# Patient Record
Sex: Female | Born: 1985 | Hispanic: Yes | Marital: Single | Smoking: Never smoker
Health system: Southern US, Community
[De-identification: ages and names within clinical notes are randomized; demographics above are authoritative.]

## PROBLEM LIST (undated history)

## (undated) DIAGNOSIS — F419 Anxiety disorder, unspecified: Secondary | ICD-10-CM

## (undated) DIAGNOSIS — F32A Depression, unspecified: Secondary | ICD-10-CM

## (undated) DIAGNOSIS — D649 Anemia, unspecified: Secondary | ICD-10-CM

## (undated) DIAGNOSIS — K8020 Calculus of gallbladder without cholecystitis without obstruction: Secondary | ICD-10-CM

## (undated) DIAGNOSIS — J45909 Unspecified asthma, uncomplicated: Secondary | ICD-10-CM

## (undated) DIAGNOSIS — Z789 Other specified health status: Secondary | ICD-10-CM

## (undated) DIAGNOSIS — R112 Nausea with vomiting, unspecified: Secondary | ICD-10-CM

## (undated) HISTORY — PX: CHOLECYSTECTOMY: SHX55

## (undated) HISTORY — DX: Unspecified asthma, uncomplicated: J45.909

## (undated) HISTORY — DX: Anxiety disorder, unspecified: F41.9

## (undated) HISTORY — DX: Depression, unspecified: F32.A

## (undated) HISTORY — DX: Anemia, unspecified: D64.9

---

## 2006-09-04 ENCOUNTER — Emergency Department (HOSPITAL_COMMUNITY): Admission: EM | Admit: 2006-09-04 | Discharge: 2006-09-04 | Payer: Self-pay | Admitting: Family Medicine

## 2006-09-07 ENCOUNTER — Emergency Department (HOSPITAL_COMMUNITY): Admission: EM | Admit: 2006-09-07 | Discharge: 2006-09-07 | Payer: Self-pay | Admitting: Emergency Medicine

## 2007-09-15 ENCOUNTER — Inpatient Hospital Stay (HOSPITAL_COMMUNITY): Admission: AD | Admit: 2007-09-15 | Discharge: 2007-09-15 | Payer: Self-pay | Admitting: Obstetrics & Gynecology

## 2007-09-15 ENCOUNTER — Ambulatory Visit: Payer: Self-pay | Admitting: *Deleted

## 2007-09-17 ENCOUNTER — Ambulatory Visit (HOSPITAL_COMMUNITY): Admission: RE | Admit: 2007-09-17 | Discharge: 2007-09-17 | Payer: Self-pay | Admitting: Obstetrics & Gynecology

## 2007-12-29 ENCOUNTER — Ambulatory Visit: Payer: Self-pay | Admitting: Physician Assistant

## 2007-12-29 ENCOUNTER — Inpatient Hospital Stay (HOSPITAL_COMMUNITY): Admission: AD | Admit: 2007-12-29 | Discharge: 2007-12-29 | Payer: Self-pay | Admitting: Obstetrics

## 2008-01-07 ENCOUNTER — Ambulatory Visit: Payer: Self-pay | Admitting: Family Medicine

## 2008-01-07 ENCOUNTER — Observation Stay (HOSPITAL_COMMUNITY): Admission: AD | Admit: 2008-01-07 | Discharge: 2008-01-07 | Payer: Self-pay | Admitting: Family Medicine

## 2008-01-10 ENCOUNTER — Inpatient Hospital Stay (HOSPITAL_COMMUNITY): Admission: AD | Admit: 2008-01-10 | Discharge: 2008-01-12 | Payer: Self-pay | Admitting: Obstetrics & Gynecology

## 2008-01-10 ENCOUNTER — Ambulatory Visit: Payer: Self-pay | Admitting: Obstetrics & Gynecology

## 2008-03-12 IMAGING — CR DG SACRUM/COCCYX 2+V
3 series · 3 of 3 positions shown · non-contrast
Comparison: none

CLINICAL DATA: Status post fall.  Sacral pain.  
 SACRUM - 2 VIEW:
 Sacrum and coccyx intact. Negative for fracture.  SI joints symmetric and normal.

[view not recorded (1 of 3)]
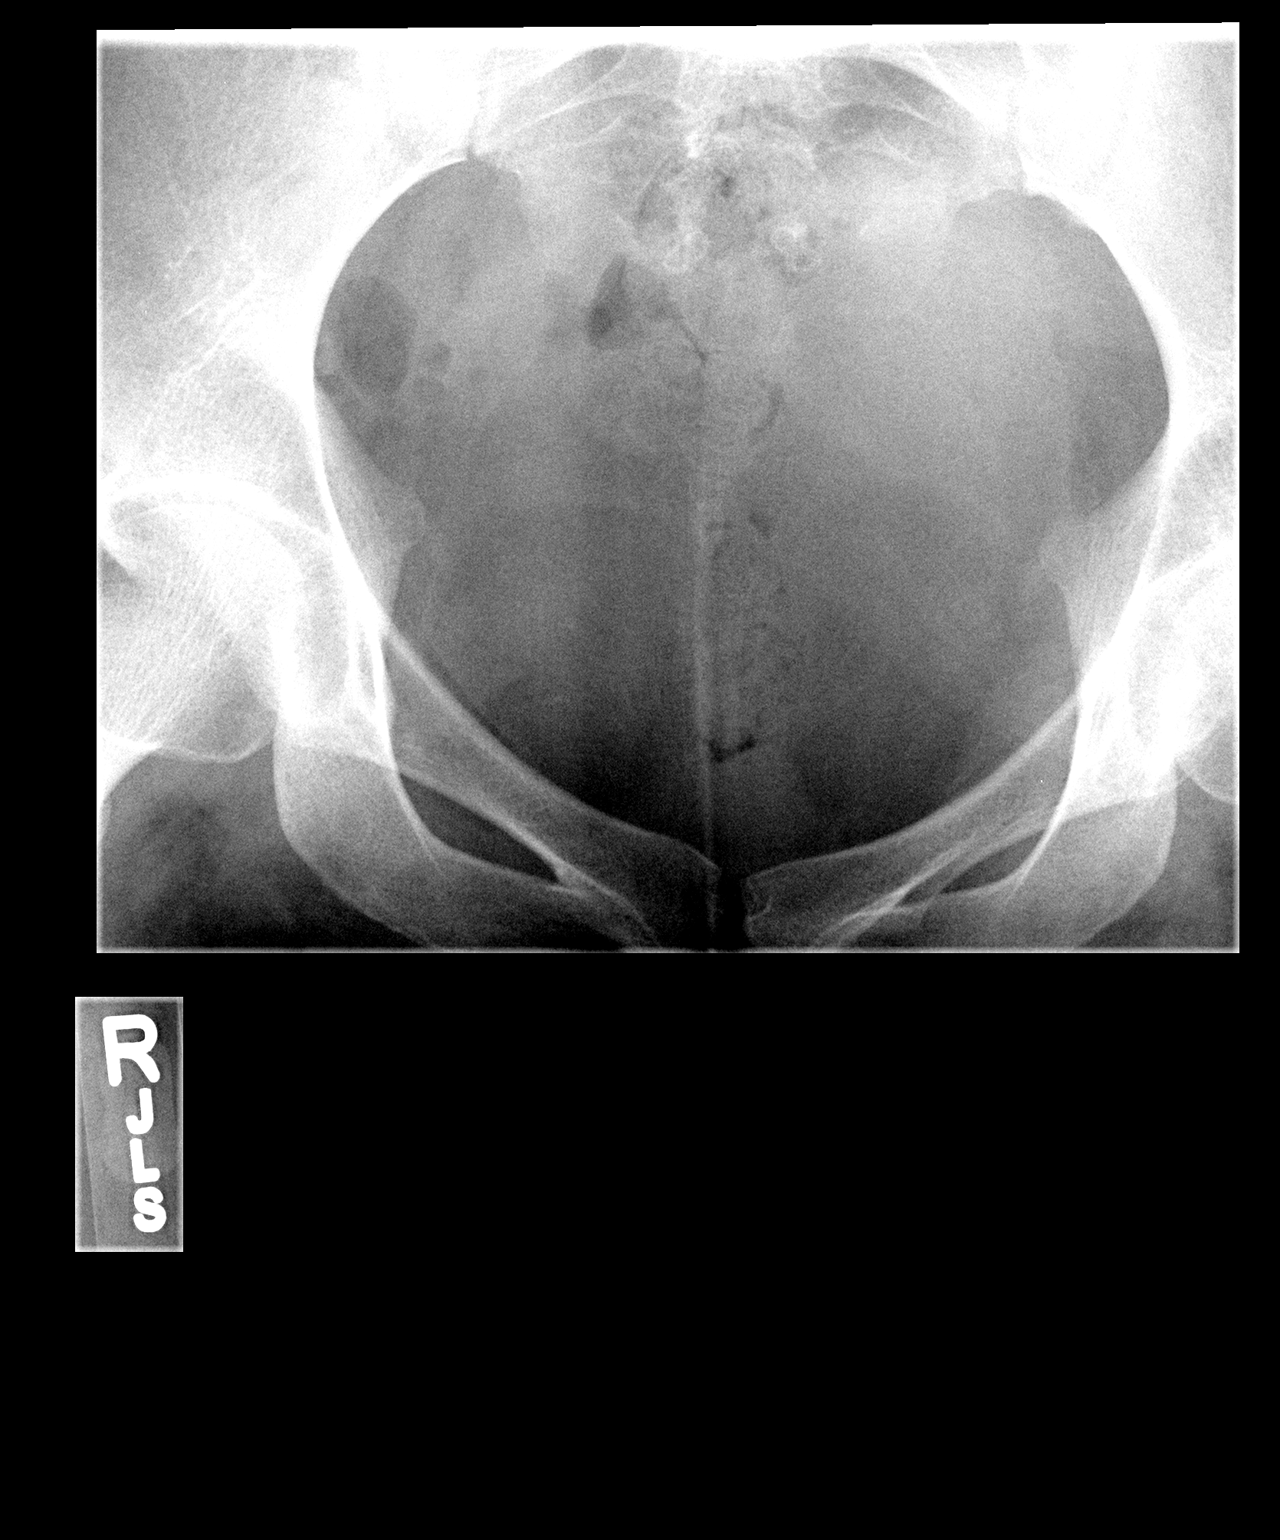

[view not recorded (2 of 3)]
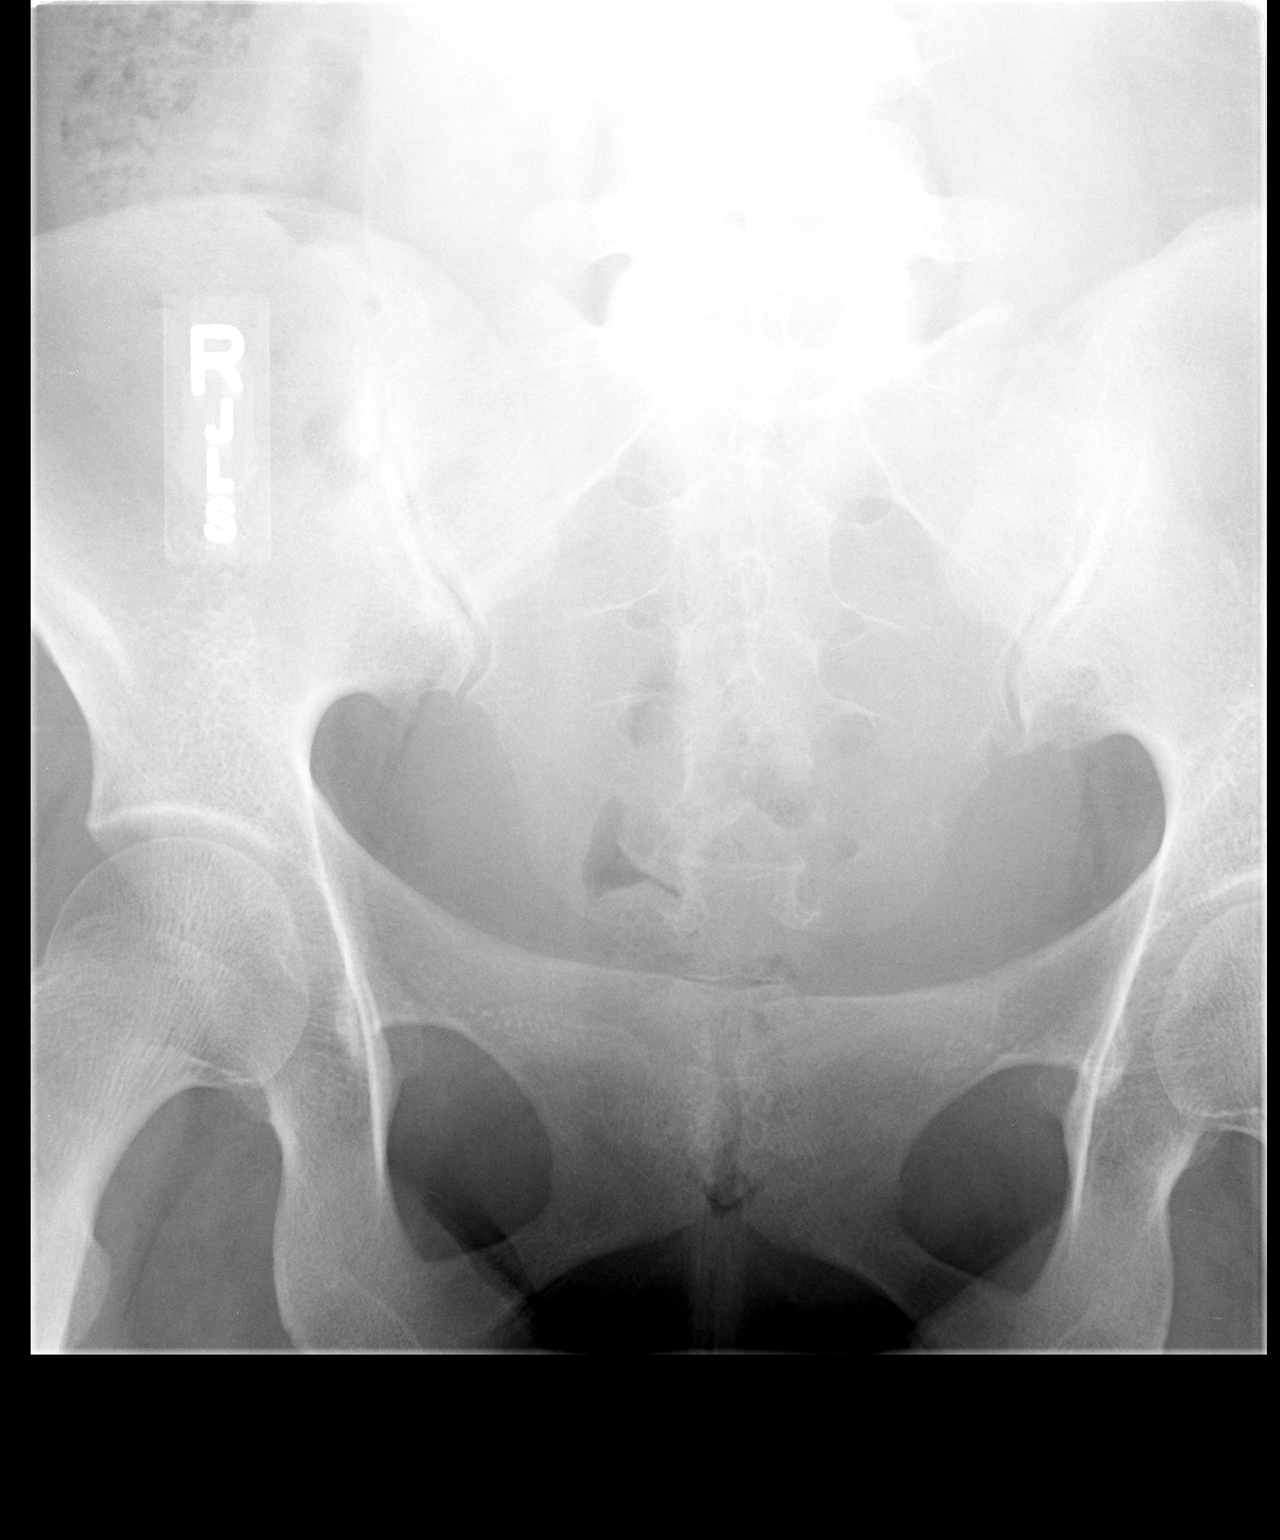

[view not recorded (3 of 3)]
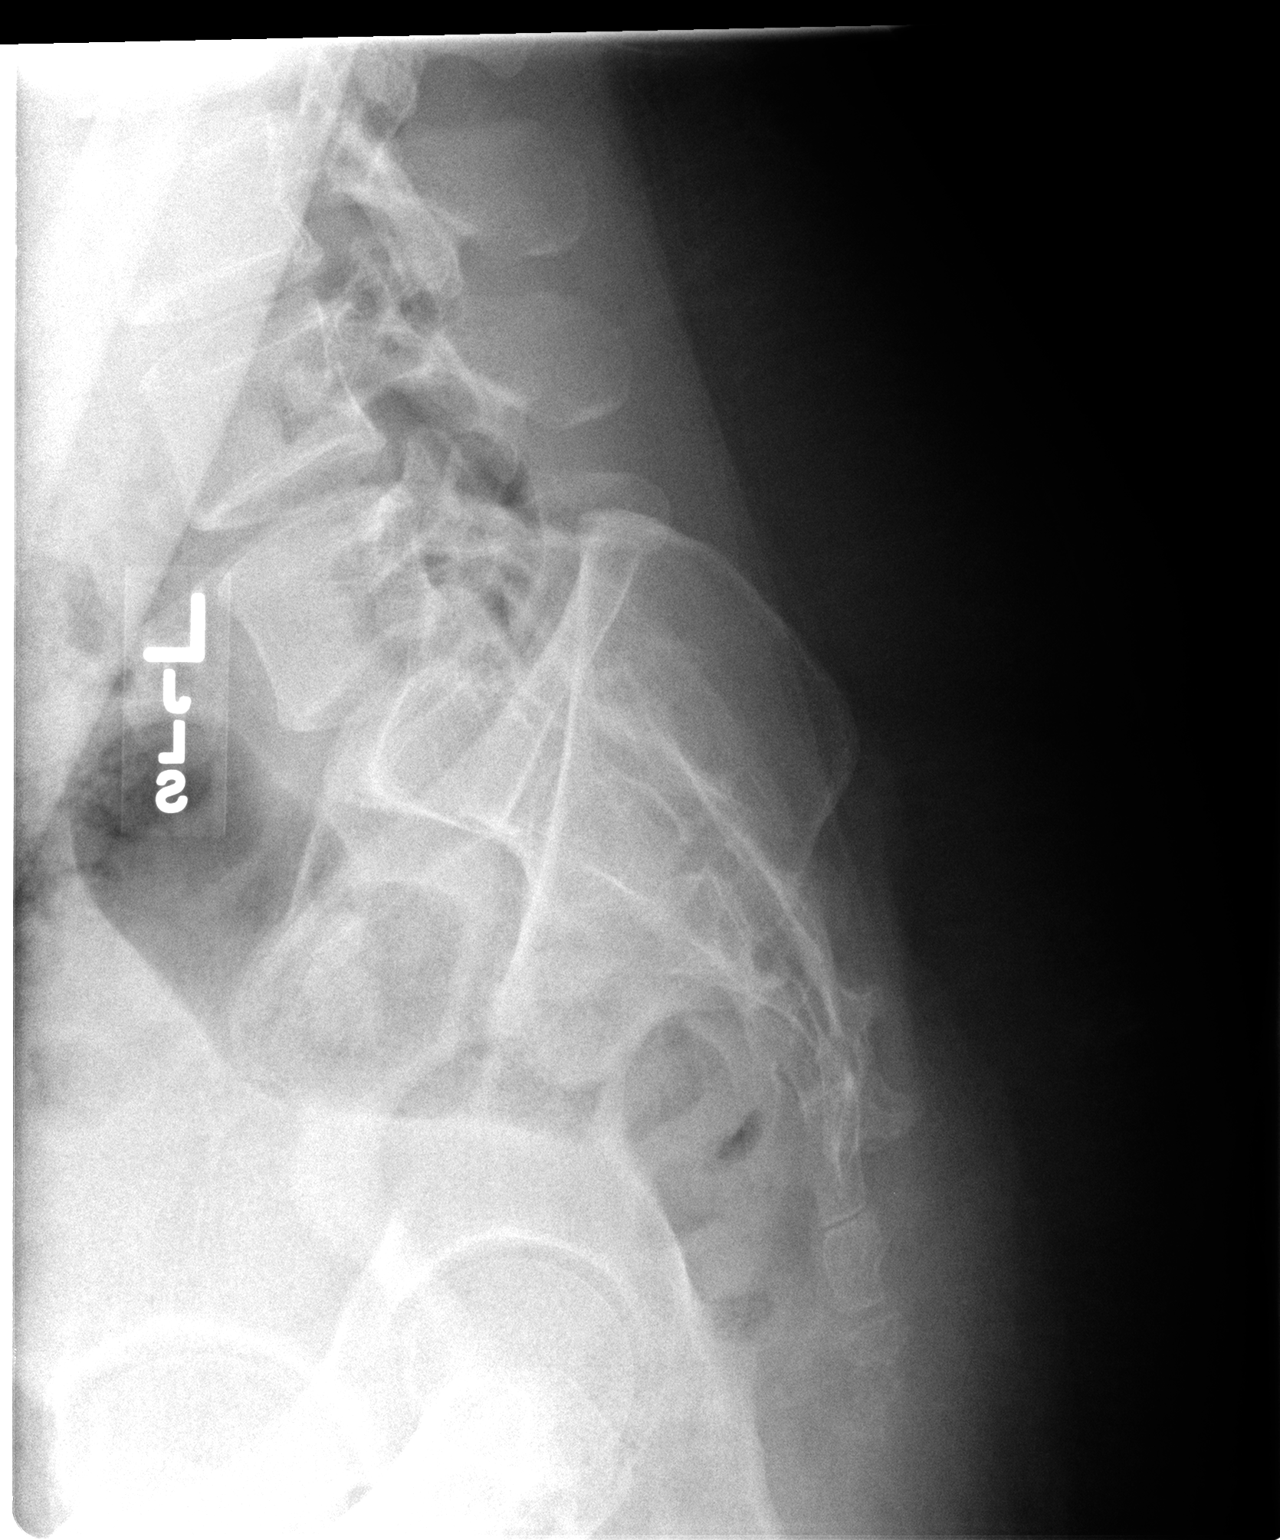

[3 of 3 positions shown; findings below may reference images not displayed]

IMPRESSION: Negative for fracture.

## 2008-08-14 ENCOUNTER — Encounter (INDEPENDENT_AMBULATORY_CARE_PROVIDER_SITE_OTHER): Payer: Self-pay | Admitting: Surgery

## 2008-08-14 ENCOUNTER — Inpatient Hospital Stay (HOSPITAL_COMMUNITY): Admission: EM | Admit: 2008-08-14 | Discharge: 2008-08-15 | Payer: Self-pay | Admitting: Family Medicine

## 2008-08-14 HISTORY — PX: CHOLECYSTECTOMY: SHX55

## 2010-04-23 ENCOUNTER — Encounter (INDEPENDENT_AMBULATORY_CARE_PROVIDER_SITE_OTHER): Payer: Self-pay | Admitting: *Deleted

## 2010-04-23 ENCOUNTER — Ambulatory Visit: Payer: Self-pay | Admitting: Family Medicine

## 2010-04-23 LAB — CONVERTED CEMR LAB
Albumin: 4.4 g/dL (ref 3.5–5.2)
Alkaline Phosphatase: 72 units/L (ref 39–117)
BUN: 13 mg/dL (ref 6–23)
Basophils Absolute: 0 10*3/uL (ref 0.0–0.1)
Basophils Relative: 0 % (ref 0–1)
Chloride: 104 meq/L (ref 96–112)
Eosinophils Absolute: 0.2 10*3/uL (ref 0.0–0.7)
Eosinophils Relative: 2 % (ref 0–5)
Glucose, Bld: 91 mg/dL (ref 70–99)
Lymphs Abs: 3.9 10*3/uL (ref 0.7–4.0)
MCV: 83.5 fL (ref 78.0–100.0)
Monocytes Relative: 7 % (ref 3–12)
Sodium: 141 meq/L (ref 135–145)
TSH: 2.007 microintl units/mL (ref 0.350–4.500)
Total Bilirubin: 0.3 mg/dL (ref 0.3–1.2)
Total Protein: 7.1 g/dL (ref 6.0–8.3)
WBC: 8.6 10*3/uL (ref 4.0–10.5)

## 2010-07-02 ENCOUNTER — Emergency Department (HOSPITAL_COMMUNITY)
Admission: EM | Admit: 2010-07-02 | Discharge: 2010-07-02 | Payer: Self-pay | Source: Home / Self Care | Admitting: Family Medicine

## 2010-09-23 LAB — POCT URINALYSIS DIPSTICK
Ketones, ur: NEGATIVE mg/dL
Protein, ur: NEGATIVE mg/dL
pH: 6 (ref 5.0–8.0)

## 2010-09-23 LAB — CBC
HCT: 40.5 % (ref 36.0–46.0)
Hemoglobin: 13.7 g/dL (ref 12.0–15.0)
MCHC: 33.8 g/dL (ref 30.0–36.0)
MCV: 83.9 fL (ref 78.0–100.0)
Platelets: 363 10*3/uL (ref 150–400)
RDW: 13.8 % (ref 11.5–15.5)
WBC: 14.1 10*3/uL — ABNORMAL HIGH (ref 4.0–10.5)

## 2010-09-23 LAB — DIFFERENTIAL
Basophils Absolute: 0 10*3/uL (ref 0.0–0.1)
Eosinophils Absolute: 0 10*3/uL (ref 0.0–0.7)
Eosinophils Relative: 0 % (ref 0–5)
Monocytes Absolute: 0.6 10*3/uL (ref 0.1–1.0)
Monocytes Relative: 4 % (ref 3–12)
Neutro Abs: 10.1 10*3/uL — ABNORMAL HIGH (ref 1.7–7.7)
Neutrophils Relative %: 72 % (ref 43–77)

## 2010-09-23 LAB — POCT I-STAT, CHEM 8
Calcium, Ion: 1.12 mmol/L (ref 1.12–1.32)
Chloride: 106 mEq/L (ref 96–112)
Hemoglobin: 15 g/dL (ref 12.0–15.0)
Potassium: 3.8 mEq/L (ref 3.5–5.1)

## 2010-09-23 LAB — CULTURE, ROUTINE-ABSCESS

## 2010-09-23 LAB — POCT PREGNANCY, URINE: Preg Test, Ur: NEGATIVE

## 2010-10-28 LAB — URINALYSIS, ROUTINE W REFLEX MICROSCOPIC
Bilirubin Urine: NEGATIVE
Glucose, UA: NEGATIVE mg/dL
Hgb urine dipstick: NEGATIVE
Ketones, ur: 15 mg/dL — AB

## 2010-10-28 LAB — DIFFERENTIAL
Basophils Relative: 0 % (ref 0–1)
Lymphs Abs: 1.9 10*3/uL (ref 0.7–4.0)
Monocytes Absolute: 0.6 10*3/uL (ref 0.1–1.0)
Monocytes Relative: 4 % (ref 3–12)
Neutro Abs: 10.7 10*3/uL — ABNORMAL HIGH (ref 1.7–7.7)
Neutrophils Relative %: 81 % — ABNORMAL HIGH (ref 43–77)

## 2010-10-28 LAB — HEPATIC FUNCTION PANEL
ALT: 127 U/L — ABNORMAL HIGH (ref 0–35)
AST: 143 U/L — ABNORMAL HIGH (ref 0–37)
Albumin: 3.7 g/dL (ref 3.5–5.2)
Indirect Bilirubin: 0.8 mg/dL (ref 0.3–0.9)
Total Bilirubin: 1.4 mg/dL — ABNORMAL HIGH (ref 0.3–1.2)

## 2010-10-28 LAB — BASIC METABOLIC PANEL
BUN: 8 mg/dL (ref 6–23)
CO2: 26 mEq/L (ref 19–32)
GFR calc non Af Amer: >60 mL/min (ref >60)
Potassium: 3.4 mEq/L — ABNORMAL LOW (ref 3.5–5.1)
Sodium: 137 mEq/L (ref 135–145)

## 2010-10-28 LAB — CBC
HCT: 40.5 % (ref 36.0–46.0)
MCHC: 33.9 g/dL (ref 30.0–36.0)
Platelets: 356 10*3/uL (ref 150–400)
RDW: 13.5 % (ref 11.5–15.5)
WBC: 13.2 10*3/uL — ABNORMAL HIGH (ref 4.0–10.5)

## 2010-10-28 LAB — PROTIME-INR: Prothrombin Time: 13.8 seconds (ref 11.6–15.2)

## 2010-10-28 LAB — APTT: aPTT: 30 seconds (ref 24–37)

## 2010-10-28 LAB — URINE MICROSCOPIC-ADD ON

## 2010-10-29 LAB — TYPE AND SCREEN: Antibody Screen: NEGATIVE

## 2010-10-29 LAB — ABO/RH: ABO/RH(D): O POS

## 2010-10-29 LAB — COMPREHENSIVE METABOLIC PANEL
AST: 292 U/L — ABNORMAL HIGH (ref 0–37)
Albumin: 3.1 g/dL — ABNORMAL LOW (ref 3.5–5.2)
Alkaline Phosphatase: 89 U/L (ref 39–117)
BUN: 6 mg/dL (ref 6–23)
BUN: 7 mg/dL (ref 6–23)
Calcium: 8.6 mg/dL (ref 8.4–10.5)
Chloride: 106 mEq/L (ref 96–112)
GFR calc Af Amer: >60 mL/min (ref >60)
Glucose, Bld: 104 mg/dL — ABNORMAL HIGH (ref 70–99)
Potassium: 3.2 mEq/L — ABNORMAL LOW (ref 3.5–5.1)
Total Protein: 5.8 g/dL — ABNORMAL LOW (ref 6.0–8.3)
Total Protein: 6 g/dL (ref 6.0–8.3)

## 2010-10-29 LAB — CBC
HCT: 36.9 % (ref 36.0–46.0)
HCT: 37.3 % (ref 36.0–46.0)
Hemoglobin: 12.2 g/dL (ref 12.0–15.0)
MCHC: 33 g/dL (ref 30.0–36.0)
Platelets: 279 10*3/uL (ref 150–400)
Platelets: 280 10*3/uL (ref 150–400)
RDW: 13.6 % (ref 11.5–15.5)
RDW: 14.2 % (ref 11.5–15.5)
WBC: 6.7 10*3/uL (ref 4.0–10.5)

## 2010-10-29 LAB — DIFFERENTIAL
Basophils Relative: 0 % (ref 0–1)
Eosinophils Relative: 0 % (ref 0–5)
Lymphocytes Relative: 35 % (ref 12–46)
Monocytes Absolute: 0.4 10*3/uL (ref 0.1–1.0)
Monocytes Relative: 6 % (ref 3–12)
Neutro Abs: 3.9 10*3/uL (ref 1.7–7.7)

## 2010-11-26 NOTE — H&P (Signed)
Lauren Barnes, Lauren Barnes              ACCOUNT NO.:  [id number]   MEDICAL RECORD NO.:  [id number]          PATIENT TYPE:  INP   LOCATION:  9110                          FACILITY:  [facility]   PHYSICIAN:  Allie Bossier, MD        DATE OF BIRTH:  Sep 30, 1985   DATE OF ADMISSION:  01/10/2008  DATE OF DISCHARGE:                              HISTORY & PHYSICAL   Lauren Barnes is a 25 year old single Hispanic gravida 1 at 40 weeks estimated  gestational age.  This is by several confirmatory ultrasounds as her LMP  gave her a due date of December 23, 2007.  She has been noted to be breech  on several ultrasounds, eversion was attempted.  this was unsuccessful.   PAST MEDICAL HISTORY:  None.   PAST SURGICAL HISTORY:  None.   SOCIAL HISTORY:  Negative for tobacco, alcohol or drug use.   FAMILY HISTORY:  Negative for breast, GYN and colon malignancies.   ALLERGIES:  No known drug allergies.   MEDICATIONS:  Prenatal vitamins daily.   REVIEW OF SYSTEMS:  The father of the baby is not involved.   PHYSICAL EXAMINATION:  VITAL SIGNS:  Stable, afebrile.  The baby is  confirmed to be in a breech presentation by Leopold's maneuvers.  HEENT:  Normal.  HEART:  Regular rhythm.  LUNGS:  Clear to auscultation bilaterally.  ABDOMEN:  Benign, gravid.   ASSESSMENT AND PLAN:  39-week pregnancy, breech presentation,  unsuccessful eversion.  We will plan for a primary low-transverse  cesarean section.  We discussed the risks of surgery.      Allie Bossier, MD  Electronically Signed     Allie Bossier, MD  Electronically Signed   MCD/MEDQ  D:  01/10/2008  T:  01/10/2008  Job:  [id number]

## 2010-11-26 NOTE — Op Note (Signed)
Lauren Barnes, Lauren Barnes              ACCOUNT NO.:  [id number]   MEDICAL RECORD NO.:  [id number]          PATIENT TYPE:  INP   LOCATION:  5120                         FACILITY:  [facility]   PHYSICIAN:  Thornton Park. Daphine Deutscher, MD  DATE OF BIRTH:  [birth year]   DATE OF PROCEDURE:  08/14/2008  DATE OF DISCHARGE:                               OPERATIVE REPORT   PREOPERATIVE DIAGNOSIS:  Acute cholecystitis.   POSTOPERATIVE DIAGNOSIS:  Acute cholecystitis.  Also, the patient  appears to have a lower C-section hernia with a little omentum in it.   PROCEDURE:  Laparoscopic cholecystectomy and intraoperative  cholangiogram.   SURGEON:  Thornton Park. Daphine Deutscher, MD   ASSISTANT:  Anselm Pancoast. Weatherly, MD   ANESTHESIA:  General endotracheal.   FINDINGS:  Acute cholecystitis, normal cholangiogram with dilated duct  and some sphincter spasm.   DESCRIPTION OF PROCEDURE:  [name] was taken to room 17 on Monday,  August 14, 2008, given general anesthesia.  The abdomen was prepped  with Techni-Care and draped sterilely.  A longitudinal incision was made  down under umbilicus and through that I inserted a Hasson cannula.  With  Hasson place, insufflation with with CO2, placed 3 trocars in upper  abdomen.  Dr. Zachery Dakins was my assist and we grasped the gallbladder and  elevated it.  I dissected free Calot triangle with a hook electrocautery  without difficulty, isolating the cystic duct and cystic arteries, put  in a clip upon the gallbladder.  The cystic duct was incised and a  Reddick catheter was inserted and a dynamic cholangiogram was obtained  which showed pretty dilated common bile duct with flow eventually into  the duodenum, although it appeared there was some distal spasm.  Cystic  duct was then triple clipped and divided and the arteries were clipped  and divided and the gallbladder was removed in toto with hook  electrocautery.  It was placed in a bag and brought through the  umbilicus.  With the  bag hooked to the gallbladder bed, it appeared to  be dry and everything was in order.  The port sites were all injected  with Marcaine and then the umbilical defect was closed with 0 Vicryl  figure-of-eight and the wounds were injected with Marcaine and closed 4-  0 Vicryl with Benzoin and Steri-Strips on the skin.  The patient  tolerated the procedure well and was taken to recovery room in  satisfactory condition.      Thornton Park Daphine Deutscher, MD  Electronically Signed     MBM/MEDQ  D:  08/14/2008  T:  08/15/2008  Job:  [id number]

## 2010-11-26 NOTE — Op Note (Signed)
NAMEKAMYIA, THOMASON              ACCOUNT NO.:  [id number]   MEDICAL RECORD NO.:  [id number]          PATIENT TYPE:  INP   LOCATION:  9110                          [facility]ACILITY:  [facility]   PHYSICIAN:  Allie Bossier, MD        DATE OF BIRTH:  [birth year]   DATE OF PROCEDURE:  01/10/2008  DATE OF DISCHARGE:                               OPERATIVE REPORT   PREOPERATIVE DIAGNOSIS:  Breech at 79 weeks' estimated gestational age,  failed version.   POSTOPERATIVE DIAGNOSIS:  Breech at 71 weeks' estimated gestational age,  failed version.   PROCEDURE:  Primary low-transverse cesarean section.   SURGEON:  Allie Bossier, MD   ANESTHESIA:  Spinal, Octaviano Glow. Pamalee Leyden, MD   COMPLICATIONS:  None.   ESTIMATED BLOOD LOSS:  Less than 500 mL.   SPECIMEN:  Cord blood.   FINDINGS:  1. Living female infant, Apgars 5 and 9 at 1 and 5 minutes.  2. Normal pelvic anatomy.  3. Intact placenta with 3-vessel cord.  4. Minimal amount of amniotic fluid noted.  5. Loose nuchal cord x2.   DETAILED PROCEDURE AND FINDINGS:  The risks, benefits, and alternatives  of the surgery, explained, understood and accepted, consents were  signed.  Breech presentation was confirmed by Leopold's maneuvers in the  preop holding area and consents were signed in the preop area.  All  questions were answered.  In the operating room, spinal anesthesia was  applied without complication.  She was placed in a dorsal supine  position with a left lateral tilt.  Her abdomen was prepped and draped  in the usual sterile fashion.  A Foley catheter was placed.  Clear urine  was drained from the Foley catheter throughout the case.  After adequate  anesthesia was assured, a transverse incision was made approximately 82  cm above the symphysis pubis.  Incision was carried down through the  scant amount of subcutaneous tissue of the fascia.  Bleeding encountered  was cauterized with a Bovie.  Fascia was scored in midline.  Fascial  incision was  extended bilaterally and curved slightly upwards.  The  middle one-third of the rectus muscles were separated in transverse  fashion using electrosurgical technique.  The pyramidalis and the middle  one-third of the rectus muscles were separated in transverse fashion  using electrosurgical technique.  Excellent hemostasis was maintained.  The remainder of the rectus muscles were gently pulled in opposite  direction.  Peritoneum was entered with hemostats.  Peritoneal incision  was extended bilaterally with the Bovie taking care to avoid the  bladder.  Bladder blade was placed.  Transverse incision was made in the  wall of lower uterine segment.  Amniotomy was performed with hemostats.  There was absence of fluid noted and the baby was in a breech  presentation with his legs crossed.  The legs were uncrossed and  delivered.  The anterior shoulder was delivered.  The baby was rotated.  The posterior shoulder was then delivered.  The neck was kept flexed and  the baby was delivered.  Loose nuchal cords were removed off  the neck x2  following delivery.  The mouth and nostrils were suctioned.  The cord  was clamped and cut.  The baby was transferred to the NICU personnel for  routine care of late.  His Apgars were as listed above.  Its weight was  7 pounds 7 ounces.  Cord blood sample was obtained.  The placenta was  extracted using traction and manual removal.  The uterus was left in  situ.  Bladder blade was placed and the uterine interior was cleaned  with a dry lap sponge.  The uterine incision was closed with a layer of  0 chromic running locking suture.  Excellent hemostasis was noted.  By  tilting the uterus at each side, I was able to visualize the adnexa.  They were normal.  The uterine incision and rectus muscles were again  inspected and noted to be hemostatic.  The fascia was closed with 0  Vicryl running nonlocking suture.  No defects were palpable.  Subcutaneous tissue was  irrigated, cleaned, and dried.  It was then  infiltrated with 13 mL of 0.5% Marcaine.  Subcuticular closure was done  with 3-0 Vicryl sutures.  Steri-Strips were placed across the incision.  Foley catheter drained clear urine throughout.  She was taken to  recovery room in stable condition.  The instrument, sponge, and needle  counts were correct.      Allie Bossier, MD  Electronically Signed     MCD/MEDQ  D:  01/10/2008  T:  01/11/2008  Job:  [id number]

## 2010-11-26 NOTE — H&P (Signed)
NAMEAYJAH, SHOW              ACCOUNT NO.:  [id number]   MEDICAL RECORD NO.:  [id number]          PATIENT TYPE:  INP   LOCATION:  5120                         FACILITY:  [facility]   PHYSICIAN:  Cherylynn Ridges, M.D.    DATE OF BIRTH:  05-08-1986   DATE OF ADMISSION:  08/13/2008  DATE OF DISCHARGE:                              HISTORY & PHYSICAL   CHIEF COMPLAINT:  The patient is a 25 year old non-English speaking  Hispanic female with abdominal pain including cholelithiasis and likely  choledocholithiasis.   HISTORY OF PRESENT ILLNESS:  The patient has been irritant about 1  0'clock the afternoon of August 13, 2008.  She had epigastric pain,  nausea, vomiting.  No fevers or chills.  She was not noticed to be  jaundiced.  This is not the first episode that she has had.  She has had  multiple, but this is the worst.   PAST MEDICAL HISTORY:  Unremarkable.   PAST SURGICAL HISTORY:  None.   MEDICATIONS:  She takes some  medication.   ALLERGIES:  She has no allergies.   SOCIAL HISTORY:  She is a nondrinker, nonsmoker.  Otherwise, has been  healthy, mildly or moderately obese.   PHYSICAL EXAMINATION:  GENERAL:  On exam, she is afebrile.  VITAL SIGNS:  Blood pressure 120/70, pulse is 60.  HEENT:  She is normocephalic and atraumatic.  She does not appear to be  icteric.  NECK:  Supple.  No palpable mass.  LUNGS:  Clear.  CARDIAC:  Regular rate and rhythm.  No murmurs, gallops, rubs, or  heaves.  ABDOMEN:  Soft and tender in the epigastrium.  She has a positive Murphy  sign with tenderness in the right upper quadrant.  RECTAL AND PELVIC:  Not performed.  EXTREMITIES:  She has got no cyanosis, clubbing or edema.  No evidence  of DVT.  There are no skin rashes.  NEUROLOGICAL:  Cranial nerves II through  XII are grossly intact.  PSYCHIATRIC:  She has a somewhat sad mood because of her onset of pain,  but has normal judgment and orientation.   LABORATORY DATA:  Her white count is  elevated at 13,000 to 22,000 with a  left shift, hemoglobin is normal.  Lipase is normal.  AST and ALT are  elevated with the normal alkaline phosphatase and a total bilirubin of  1.3.  The lipase is normal.  Ultrasound demonstrates a 12 common bile  duct.   IMPRESSION:  Cholelithiasis and likely choledocholithiasis and then an  otherwise healthy 25 year old Hispanic female.   PLAN:  The plan is to admit the patient for pain control, IV  antibiotics, and IV hydration.  We will also get a GI consultation as  the patient will possibly need an ERCP depending upon her repeat studies  in the morning.      Cherylynn Ridges, M.D.  Electronically Signed     JOW/MEDQ  D:  08/14/2008  T:  08/14/2008  Job:  [id number]

## 2010-11-26 NOTE — Discharge Summary (Signed)
[name], Lauren              ACCOUNT NO.:  [id number]   MEDICAL RECORD NO.:  [id number]          PATIENT TYPE:  INP   LOCATION:  5120                         FACILITY:  [facility]   PHYSICIAN:  Thornton Park. Daphine Deutscher, MD  DATE OF BIRTH:  10/29/1985   DATE OF ADMISSION:  08/13/2008  DATE OF DISCHARGE:  08/15/2008                               DISCHARGE SUMMARY   ADMITTING PHYSICIAN:  Dr. Lindie Spruce.   DISCHARGING PHYSICIAN:  Dr. Daphine Deutscher.   CHIEF COMPLAINT/REASON FOR ADMISSION:  Lauren Barnes is a 22-year female  patient, who speaks mainly Spanish, who presented with epigastric  abdominal pain that began about 1 p.m. on August 13, 2008, it persisted  and awakened her from sleep.  She has had nausea and vomiting.  No  fevers.  She presented to the ER where she was found to have a  leukocytosis 13,200, AST 143, ALT 127, alkaline phos was normal, and  total bilirubin was 1.4.  She was tender in the epigastric region and  the right upper quadrant on exam per the emergency room physician's  report and her abdominal ultrasound showed cholelithiasis and common  bile duct dilated to 12 mm.  Dr. Lindie Spruce examined the patient in the ER.  The patient looks somewhat pale, was tender in the epigastric region and  the right upper quadrant regions on his exam, and he felt the patient  had acute cholecystitis and question choledocholithiasis.   ADMITTING DIAGNOSES:  1. Acute cholecystitis.  2. Transaminitis with dilated common bile duct to rule out      choledocholithiasis.   HOSPITAL COURSE:  The patient was admitted to the General Surgical Floor  where she remained afebrile.  Vital signs were stable.  Her abdomen was  soft with minimal right upper quadrant tenderness after administration  of IV pain medications.  Her followup lab work revealed a normal white  count of 6700, total bilirubin 1.5, AST up to 292, and ALT 237.  Dr.  Daphine Deutscher examined the patient and felt that although her liver enzymes  were  mildly trending upwards that this was not indicative of  choledocholithiasis given a normal alk phos and a minimally elevated  total bilirubin, so he felt it was safe to proceed with operative  intervention.   On August 14, 2008, the patient was taken the OR by Dr. Daphine Deutscher.  At  admission, she had been empirically placed on Cipro IV for enteric  pathogen coverage, this was continued.  The patient subsequently  underwent a laparoscopic cholecystectomy with a normal intraoperative  cholangiogram and was sent back to the general floor to recover.   In immediate postop period, the patient did well.  She did have 2  episodes within the first 12 hours of significant urinary retention, the  first episode was 1100 mL of urine retained removed with I and O cath,  the second was early on the morning of postop day #1 about 5 a.m.  She  had I and O cath done then that relieved the bladder of 800 mL of fluid.  By my exam on postop day #1, her abdomen  was tender over incisions and  nondistended.  She had not started a diet, was still on PCA morphine.  Plans were to advance diet, add Toradol to minimize narcotic use in the  event this was contributing to some of her urinary retention, and  hopefully if the patient's diet was advanced tolerating oral pain  medicines and voiding without difficulty, we can discharge her home  today on postop day #1.  As noted, her incisions are clean, dry, and  intact and she is tender over the incision lines only not away from the  incisions.  Plans were to have the patient's discharge instructions  given to her verbally in Spanish and also the nurses have provided care  notes in Spanish to send home with the patient regarding her condition  and anticipated followup issues.   FINAL DISCHARGE DIAGNOSES:  1. Acute cholecystitis and cholelithiasis.  2. Status post laparoscopic cholecystectomy with normal intraoperative      cholangiogram.  3. Postoperative urinary  retention.   DISCHARGE MEDICATIONS:  The patient was not on medications before  admission.  These are her medications at discharge:  1. Percocet 5/325 one to two tablets every 4 hours as needed for pain.  2. Ibuprofen 600 mg 1 tablet every 8 hours as needed for pain, may      take in addition to Percocet, i.e., in between doses as ordered.      Always take with food or snacks.   Return to work in 2 weeks, note given.   DIET:  No restrictions.   WOUND CARE:  Remove bandages tomorrow on August 16, 2008, and after  that allow Steri-Strips to fall off.   ACTIVITY:  Increase activity slowly.  May walk up steps.  May shower.  No lifting greater than 15 pounds for 2 weeks.  No driving for 1 week.   FOLLOWUP APPOINTMENT:  The patient has been instructed to follow up at  the [facility] at [facility] on Tuesday,  August 29, 2008, at 2:15 p.m.  She is to arrive at 2 p.m.   ADDITIONAL INSTRUCTIONS:  The patient is to call the MD's office at [phone] if she has:  A.  Fever greater than or equal to 101.5 degrees Fahrenheit by mouth.  B.  New or increased belly pain.  C.  Redness or drainage from her wounds.  D.  Nausea, vomiting, diarrhea, or constipation.       Allison L. Gwyneth Sprout Daphine Deutscher, MD  Electronically Signed    ALE/MEDQ  D:  08/15/2008  T:  08/15/2008  Job:  [id number]

## 2010-11-29 NOTE — Discharge Summary (Signed)
Lauren Barnes, Lauren Barnes              ACCOUNT NO.:  [id number]   MEDICAL RECORD NO.:  [id number]          PATIENT TYPE:  INP   LOCATION:  9110                          FACILITY:  [facility]   PHYSICIAN:  Tanya S. Shawnie Pons, M.D.   DATE OF BIRTH:  [birth year]   DATE OF ADMISSION:  01/10/2008  DATE OF DISCHARGE:  01/12/2008                               DISCHARGE SUMMARY   Lauren Barnes was admitted on January 10, 2008, for a primary low-transverse  cesarean section for breech presentation after several failed attempts  at ultrasound-assisted version was unsuccessful at 39 weeks' gestation.  Lauren Barnes is a 25 year old gravida 1 at 66 weeks' gestation, who had  no past medical history and no past surgical history.   ADMITTING DIAGNOSES:  1. A 39-week gestation.  2. Primigravida.  3. Primary low-transverse cesarean section.  4. Breech presentation after failed version.   OPERATION PERFORMED:  Primary low-transverse cesarean section for breech  presentation.   POSTOPERATIVE DIAGNOSES:  1. A 39-week gestation.  2. Primigravida.  3. Primary low-transverse cesarean section.  4. Breech presentation after failed version.   Procedure was performed by Dr. Nicholaus Bloom and was uncomplicated.   ESTIMATED BLOOD LOSS:  500 mL.   FINDINGS:  Findings of that surgery were a living female infant.  Apgars  were 5 and 9.  For further details, please see the dictated operative  note.   The patient had an uneventful postoperative course.  Throughout the  course of her stay, she remained afebrile.  Vital signs remained stable.  The patient was discharged on postoperative day #3 on January 12, 2008.  At  which time, her postoperative hemoglobin was 10.3 and hematocrit was  29.7.  Vital signs were stable.  Exam was found to be within normal  limits.  Her status was stable.   DISCHARGE DIAGNOSES:  A 25 year old gravida 1, para 1-0-0-1.  On  postoperative day #3, status post primary low-transverse cesarean  section for  breech presentation.  She was given Depo-Provera 150 mg IM  prior to discharge.  She was breast and bottle feeding at the time of  discharge.  She was given prescriptions for Motrin 600 mg p.o. q.6 h  p.r.n. and Percocet 5/325 1-2 tablets q. 4-6 hours p.r.n.  She was given  instructions for engorgement, pelvic rest x4-6 weeks until bleeding was  ceased, driving precautions while on narcotic pain medications, and  followup appointments were made for 6 weeks for the [facility] for a 6-week postpartum visit.  Appointment was also  made with the [facility] nurse to check incision in 1-2  weeks postpartum.      Maylon Cos, C.N.M.      ______________________________  Shelbie Proctor. Shawnie Pons, M.D.    SS/MEDQ  D:  04/20/2008  T:  04/21/2008  Job:  [id number]

## 2011-04-10 LAB — CBC
HCT: 29.7 — ABNORMAL LOW
HCT: 38.5
Hemoglobin: 10.3 — ABNORMAL LOW
Hemoglobin: 13.1
MCHC: 34.8
MCV: 87.1
MCV: 87.3
RBC: 4.41
RDW: 13.5
WBC: 8

## 2013-07-14 NOTE — L&D Delivery Note (Signed)
Delivery Note At 5:04 PM a viable female was delivered via VBAC, Spontaneous (Presentation: ; Occiput Anterior).  APGAR: 9, 9; weight 9 lb 0.3 oz (4090 g).   Placenta status: Intact, Spontaneous.  Cord: 3 vessels with the following complications: None.  Cord pH: NA. Moderate bleeding immediately after delivery. Unsure if it was from laceration or uterus. Bleeding decreased spontaneously, but Cytotec given.   Anesthesia: None  Episiotomy: None Lacerations: Deep 2nd degree. Skin lacerated down to anal sphincter, but sphincter and rectal wall intact.  Suture Repair: 2.0 3.0 vicryl rapide Est. Blood Loss (mL): 400  Mom to postpartum.  Baby to Couplet care / Skin to Skin. Placenta to: BS Feeding: Breast Circ: No Contraception: Lauren Barnes  Lauren Barnes 04/16/2014, 6:35 PM

## 2013-11-07 ENCOUNTER — Other Ambulatory Visit (HOSPITAL_COMMUNITY): Payer: Self-pay | Admitting: Physician Assistant

## 2013-11-07 DIAGNOSIS — Z3689 Encounter for other specified antenatal screening: Secondary | ICD-10-CM

## 2013-11-07 LAB — OB RESULTS CONSOLE ABO/RH: RH Type: POSITIVE

## 2013-11-07 LAB — OB RESULTS CONSOLE HEPATITIS B SURFACE ANTIGEN: HEP B S AG: NEGATIVE

## 2013-11-07 LAB — OB RESULTS CONSOLE GC/CHLAMYDIA
CHLAMYDIA, DNA PROBE: NEGATIVE
Gonorrhea: NEGATIVE

## 2013-11-07 LAB — OB RESULTS CONSOLE HIV ANTIBODY (ROUTINE TESTING): HIV: NONREACTIVE

## 2013-11-07 LAB — OB RESULTS CONSOLE RPR: RPR: NONREACTIVE

## 2013-11-07 LAB — OB RESULTS CONSOLE RUBELLA ANTIBODY, IGM: Rubella: IMMUNE

## 2013-11-21 ENCOUNTER — Ambulatory Visit (HOSPITAL_COMMUNITY)
Admission: RE | Admit: 2013-11-21 | Discharge: 2013-11-21 | Disposition: A | Discharge status: Home / Self Care | Payer: Medicaid Other | Source: Ambulatory Visit | Attending: Physician Assistant | Admitting: Physician Assistant

## 2013-11-21 ENCOUNTER — Encounter (HOSPITAL_COMMUNITY): Payer: Self-pay

## 2013-11-21 DIAGNOSIS — Z3689 Encounter for other specified antenatal screening: Secondary | ICD-10-CM

## 2013-11-24 ENCOUNTER — Other Ambulatory Visit (HOSPITAL_COMMUNITY): Payer: Self-pay | Admitting: Physician Assistant

## 2013-11-24 DIAGNOSIS — IMO0002 Reserved for concepts with insufficient information to code with codable children: Secondary | ICD-10-CM

## 2013-11-24 DIAGNOSIS — Z0489 Encounter for examination and observation for other specified reasons: Secondary | ICD-10-CM

## 2014-01-09 ENCOUNTER — Ambulatory Visit (HOSPITAL_COMMUNITY)
Admission: RE | Admit: 2014-01-09 | Discharge: 2014-01-09 | Disposition: A | Discharge status: Home / Self Care | Payer: Medicaid Other | Source: Ambulatory Visit | Attending: Physician Assistant | Admitting: Physician Assistant

## 2014-01-09 DIAGNOSIS — Z0489 Encounter for examination and observation for other specified reasons: Secondary | ICD-10-CM

## 2014-01-09 DIAGNOSIS — Z363 Encounter for antenatal screening for malformations: Secondary | ICD-10-CM | POA: Insufficient documentation

## 2014-01-09 DIAGNOSIS — IMO0002 Reserved for concepts with insufficient information to code with codable children: Secondary | ICD-10-CM

## 2014-01-09 DIAGNOSIS — Z1389 Encounter for screening for other disorder: Secondary | ICD-10-CM | POA: Insufficient documentation

## 2014-04-15 ENCOUNTER — Inpatient Hospital Stay (HOSPITAL_COMMUNITY)
Admission: AD | Admit: 2014-04-15 | Discharge: 2014-04-15 | Disposition: A | Discharge status: Home / Self Care | Payer: Medicaid Other | Source: Ambulatory Visit | Attending: Family Medicine | Admitting: Family Medicine

## 2014-04-15 ENCOUNTER — Encounter (HOSPITAL_COMMUNITY): Payer: Self-pay | Admitting: *Deleted

## 2014-04-15 DIAGNOSIS — Z3A39 39 weeks gestation of pregnancy: Secondary | ICD-10-CM | POA: Insufficient documentation

## 2014-04-15 HISTORY — DX: Other specified health status: Z78.9

## 2014-04-15 NOTE — Discharge Instructions (Signed)
Contracciones de Braxton Hicks °(Braxton Hicks Contractions) °Durante el embarazo, pueden presentarse contracciones uterinas que no siempre indican que está en trabajo de parto.  °¿QUÉ SON LAS CONTRACCIONES DE BRAXTON HICKS?  °Las contracciones que se presentan antes del trabajo de parto se conocen como contracciones de Braxton Hicks o falso trabajo de parto. Hacia el final del embarazo (32 a 34 semanas), estas contracciones pueden aparecen con más frecuencia y volverse más intensas. No corresponden al trabajo de parto verdadero porque estas contracciones no producen el agrandamiento (la dilatación) y el afinamiento del cuello del útero. Algunas veces, es difícil distinguirlas del trabajo de parto verdadero porque en algunos casos pueden ser muy intensas, y las personas tienen diferentes niveles de tolerancia al dolor. No debe sentirse avergonzada si concurre al hospital con falso trabajo de parto. En ocasiones, la única forma de saber si el trabajo de parto es verdadero es que el médico determine si hay cambios en el cuello del útero. °Si no hay problemas prenatales u otras complicaciones de salud asociadas con el embarazo, no habrá inconvenientes si la envían a su casa con falso trabajo de parto y espera que comience el verdadero. °CÓMO DIFERENCIAR EL TRABAJO DE PARTO FALSO DEL VERDADERO °Falso trabajo de parto °· Las contracciones del falso trabajo de parto duran menos y no son tan intensas como las verdaderas. °· Generalmente son irregulares. °· A menudo, se sienten en la parte delantera de la parte baja del abdomen y en la ingle, °· y pueden desaparecer cuando camina o cambia de posición mientras está acostada. °· Las contracciones se vuelven más débiles y su duración es menor a medida que el tiempo transcurre. °· Por lo general, no se hacen progresivamente más intensas, regulares y cercanas entre sí como en el caso del trabajo de parto verdadero. °Verdadero trabajo de parto °· Las contracciones del verdadero  trabajo de parto duran de 30 a 70 segundos, son muy regulares y suelen volverse más intensas, y aumenta su frecuencia. °· No desaparecen cuando camina. °· La molestia generalmente se siente en la parte superior del útero y se extiende hacia la zona inferior del abdomen y hacia la cintura. °· El médico podrá examinarla para determinar si el trabajo de parto es verdadero. El examen mostrará si el cuello del útero se está dilatando y afinando. °LO QUE DEBE RECORDAR °· Continúe haciendo los ejercicios habituales y siga otras indicaciones que el médico le dé. °· Tome todos los medicamentos como le indicó el médico. °· Concurra a las visitas prenatales regulares. °· Coma y beba con moderación si cree que está en trabajo de parto. °· Si las contracciones de Braxton Hicks le provocan incomodidad: °¨ Cambie de posición: si está acostada o descansando, camine; si está caminando, descanse. °¨ Siéntese y descanse en una bañera con agua tibia. °¨ Beba 2 o 3 vasos de agua. La deshidratación puede provocar contracciones. °¨ Respire lenta y profundamente varias veces por hora. °¿CUÁNDO DEBO BUSCAR ASISTENCIA MÉDICA INMEDIATA? °Solicite atención médica de inmediato si: °· Las contracciones se intensifican, se hacen más regulares y cercanas entre sí. °· Tiene una pérdida de líquido por la vagina. °· Tiene fiebre. °· Elimina mucosidad manchada con sangre. °· Tiene una hemorragia vaginal abundante. °· Tiene dolor abdominal permanente. °· Tiene un dolor en la zona lumbar que nunca tuvo antes. °· Siente que la cabeza del bebé empuja hacia abajo y ejerce presión en la zona pélvica. °· El bebé no se mueve tanto como solía. °Document Released: 04/09/2005 Document Revised: 07/05/2013 °ExitCare® Patient   Information ©2015 [employer]. This information is not intended to replace advice given to you by your health care provider. Make sure you discuss any questions you have with your health care provider. ° °Evaluación de los movimientos  fetales  °(Fetal Movement Counts) °Nombre del paciente: __________________________________________________ Fecha de parto estimada: ____________________ °La evaluación de los movimientos fetales es muy recomendable en los embarazos de alto riesgo, pero también es una buena idea que lo hagan todas las embarazadas. El médico le indicará que comience a contarlos a las 28 semanas de embarazo. Los movimientos fetales suelen aumentar:  °· Después de una comida completa. °· Después de la actividad física. °· Después de comer o beber algo dulce o frío. °· En reposo. °Preste atención cuando sienta que el bebé está más activo. Esto le ayudará a notar un patrón de ciclos de vigilia y sueño de su bebé y cuáles son los factores que contribuyen a un aumento de los movimientos fetales. Es importante llevar a cabo un recuento de movimientos fetales, al mismo tiempo cada día, cuando el bebé normalmente está más activo.  °CÓMO CONTAR LOS MOVIMIENTOS FETALES °1. Busque un lugar tranquilo y cómodo para sentarse o recostarse sobre el lado izquierdo. Al recostarse sobre su lado izquierdo, le proporciona una mejor circulación de sangre y oxígeno al bebé. °2. Anote el día y la hora en una hoja de papel o en un diario. °3. Comience contando las pataditas, revoloteos, chasquidos, vueltas o pinchazos en un período de 2 horas. Debe sentir al menos 10 movimientos en 2 horas. °4. Si no siente 10 movimientos en 2 horas, espere 2 ó 3 horas y cuente de nuevo. Busque cambios en el patrón o si no cuenta lo suficiente en 2 horas. °SOLICITE ATENCIÓN MÉDICA SI:  °· Siente menos de 10 pataditas en 2 horas, en dos intentos. °· No hay movimientos durante una hora. °· El patrón se modifica o le lleva más tiempo cada día contar las 10 pataditas. °· Siente que el bebé no se mueve como lo hace habitualmente. °Fecha: ____________ Movimientos: ____________ Hora de inicio: ____________ Hora de finalización: ____________  °Fecha: ____________ Movimientos:  ____________ Hora de inicio: ____________ Hora de finalización: ____________  °Fecha: ____________ Movimientos: ____________ Hora de inicio: ____________ Hora de finalización: ____________  °Fecha: ____________ Movimientos: ____________ Hora de inicio: ____________ Hora de finalización: ____________  °Fecha: ____________ Movimientos: ____________ Hora de inicio: ____________ Hora de finalización: ____________  °Fecha: ____________ Movimientos: ____________ Hora de inicio: ____________ Hora de finalización: ____________  °Fecha: ____________ Movimientos: ____________ Hora de inicio: ____________ Hora de finalización: ____________  °Fecha: ____________ Movimientos: ____________ Hora de inicio: ____________ Hora de finalización: ____________  °Fecha: ____________ Movimientos: ____________ Hora de inicio: ____________ Hora de finalización: ____________  °Fecha: ____________ Movimientos: ____________ Hora de inicio: ____________ Hora de finalización: ____________  °Fecha: ____________ Movimientos: ____________ Hora de inicio: ____________ Hora de finalización: ____________  °Fecha: ____________ Movimientos: ____________ Hora de inicio: ____________ Hora de finalización: ____________  °Fecha: ____________ Movimientos: ____________ Hora de inicio: ____________ Hora de finalización: ____________  °Fecha: ____________ Movimientos: ____________ Hora de inicio: ____________ Hora de finalización: ____________  °Fecha: ____________ Movimientos: ____________ Hora de inicio: ____________ Hora de finalización: ____________  °Fecha: ____________ Movimientos: ____________ Hora de inicio: ____________ Hora de finalización: ____________  °Fecha: ____________ Movimientos: ____________ Hora de inicio: ____________ Hora de finalización: ____________  °Fecha: ____________ Movimientos: ____________ Hora de inicio: ____________ Hora de finalización: ____________  °Fecha: ____________ Movimientos: ____________ Hora de inicio: ____________  Hora de finalización: ____________  °Fecha:   ____________ Movimientos: ____________ Hora de inicio: ____________ Hora de finalización: ____________  °Fecha: ____________ Movimientos: ____________ Hora de inicio: ____________ Hora de finalización: ____________  °Fecha: ____________ Movimientos: ____________ Hora de inicio: ____________ Hora de finalización: ____________  °Fecha: ____________ Movimientos: ____________ Hora de inicio: ____________ Hora de finalización: ____________  °Fecha: ____________ Movimientos: ____________ Hora de inicio: ____________ Hora de finalización: ____________  °Fecha: ____________ Movimientos: ____________ Hora de inicio: ____________ Hora de finalización: ____________  °Fecha: ____________ Movimientos: ____________ Hora de inicio: ____________ Hora de finalización: ____________  °Fecha: ____________ Movimientos: ____________ Hora de inicio: ____________ Hora de finalización: ____________  °Fecha: ____________ Movimientos: ____________ Hora de inicio: ____________ Hora de finalización: ____________  °Fecha: ____________ Movimientos: ____________ Hora de inicio: ____________ Hora de finalización: ____________  °Fecha: ____________ Movimientos: ____________ Hora de inicio: ____________ Hora de finalización: ____________  °Fecha: ____________ Movimientos: ____________ Hora de inicio: ____________ Hora de finalización: ____________  °Fecha: ____________ Movimientos: ____________ Hora de inicio: ____________ Hora de finalización: ____________  °Fecha: ____________ Movimientos: ____________ Hora de inicio: ____________ Hora de finalización: ____________  °Fecha: ____________ Movimientos: ____________ Hora de inicio: ____________ Hora de finalización: ____________  °Fecha: ____________ Movimientos: ____________ Hora de inicio: ____________ Hora de finalización: ____________  °Fecha: ____________ Movimientos: ____________ Hora de inicio: ____________ Hora de finalización: ____________  °Fecha:  ____________ Movimientos: ____________ Hora de inicio: ____________ Hora de finalización: ____________  °Fecha: ____________ Movimientos: ____________ Hora de inicio: ____________ Hora de finalización: ____________  °Fecha: ____________ Movimientos: ____________ Hora de inicio: ____________ Hora de finalización: ____________  °Fecha: ____________ Movimientos: ____________ Hora de inicio: ____________ Hora de finalización: ____________  °Fecha: ____________ Movimientos: ____________ Hora de inicio: ____________ Hora de finalización: ____________  °Fecha: ____________ Movimientos: ____________ Hora de inicio: ____________ Hora de finalización: ____________  °Fecha: ____________ Movimientos: ____________ Hora de inicio: ____________ Hora de finalización: ____________  °Fecha: ____________ Movimientos: ____________ Hora de inicio: ____________ Hora de finalización: ____________  °Fecha: ____________ Movimientos: ____________ Hora de inicio: ____________ Hora de finalización: ____________  °Fecha: ____________ Movimientos: ____________ Hora de inicio: ____________ Hora de finalización: ____________  °Fecha: ____________ Movimientos: ____________ Hora de inicio: ____________ Hora de finalización: ____________  °Fecha: ____________ Movimientos: ____________ Hora de inicio: ____________ Hora de finalización: ____________  °Fecha: ____________ Movimientos: ____________ Hora de inicio: ____________ Hora de finalización: ____________  °Fecha: ____________ Movimientos: ____________ Hora de inicio: ____________ Hora de finalización: ____________  °Fecha: ____________ Movimientos: ____________ Hora de inicio: ____________ Hora de finalización: ____________  °Fecha: ____________ Movimientos: ____________ Hora de inicio: ____________ Hora de finalización: ____________  °Fecha: ____________ Movimientos: ____________ Hora de inicio: ____________ Hora de finalización: ____________  °Fecha: ____________ Movimientos: ____________ Hora  de inicio: ____________ Hora de finalización: ____________  °Fecha: ____________ Movimientos: ____________ Hora de inicio: ____________ Hora de finalización: ____________  °Fecha: ____________ Movimientos: ____________ Hora de inicio: ____________ Hora de finalización: ____________  °Document Released: 10/07/2007 Document Revised: 06/16/2012 °ExitCare® Patient Information ©2015 [employer]. This information is not intended to replace advice given to you by your health care provider. Make sure you discuss any questions you have with your health care provider. ° °

## 2014-04-15 NOTE — MAU Note (Signed)
Contractions started around 0030 but stopped around 0800.  Denies LOF, some vaginal bleeding.

## 2014-04-16 ENCOUNTER — Encounter (HOSPITAL_COMMUNITY): Payer: Self-pay | Admitting: *Deleted

## 2014-04-16 ENCOUNTER — Inpatient Hospital Stay (HOSPITAL_COMMUNITY)
Admission: AD | Admit: 2014-04-16 | Discharge: 2014-04-18 | DRG: 775 | Disposition: A | Discharge status: Home / Self Care | Payer: Medicaid Other | Source: Ambulatory Visit | Attending: Obstetrics and Gynecology | Admitting: Obstetrics and Gynecology

## 2014-04-16 DIAGNOSIS — O3421 Maternal care for scar from previous cesarean delivery: Secondary | ICD-10-CM | POA: Diagnosis present

## 2014-04-16 DIAGNOSIS — K5900 Constipation, unspecified: Secondary | ICD-10-CM

## 2014-04-16 DIAGNOSIS — IMO0001 Reserved for inherently not codable concepts without codable children: Secondary | ICD-10-CM

## 2014-04-16 DIAGNOSIS — O34219 Maternal care for unspecified type scar from previous cesarean delivery: Secondary | ICD-10-CM | POA: Diagnosis not present

## 2014-04-16 DIAGNOSIS — Z3A39 39 weeks gestation of pregnancy: Secondary | ICD-10-CM | POA: Diagnosis present

## 2014-04-16 DIAGNOSIS — Z3483 Encounter for supervision of other normal pregnancy, third trimester: Secondary | ICD-10-CM | POA: Diagnosis present

## 2014-04-16 LAB — HIV ANTIBODY (ROUTINE TESTING W REFLEX): HIV 1&2 Ab, 4th Generation: NONREACTIVE

## 2014-04-16 LAB — RPR

## 2014-04-16 LAB — POCT FERN TEST: POCT Fern Test: POSITIVE

## 2014-04-16 LAB — CBC
HCT: 40.4 % (ref 36.0–46.0)
Hemoglobin: 13.9 g/dL (ref 12.0–15.0)
MCH: 29.3 pg (ref 26.0–34.0)
MCHC: 34.4 g/dL (ref 30.0–36.0)
MCV: 85.2 fL (ref 78.0–100.0)
PLATELETS: 252 10*3/uL (ref 150–400)
RBC: 4.74 MIL/uL (ref 3.87–5.11)
RDW: 14 % (ref 11.5–15.5)
WBC: 12.4 10*3/uL — ABNORMAL HIGH (ref 4.0–10.5)

## 2014-04-16 LAB — OB RESULTS CONSOLE GBS: GBS: NEGATIVE

## 2014-04-16 LAB — GROUP B STREP BY PCR: GROUP B STREP BY PCR: NEGATIVE

## 2014-04-16 MED ORDER — MAGNESIUM HYDROXIDE 400 MG/5ML PO SUSP: 30.0000 mL | ORAL | Status: DC | PRN

## 2014-04-16 MED ORDER — OXYCODONE-ACETAMINOPHEN 5-325 MG PO TABS: 1.0000 | ORAL_TABLET | ORAL | Status: DC | PRN

## 2014-04-16 MED ORDER — OXYTOCIN BOLUS FROM INFUSION: 500.0000 mL | INTRAVENOUS | Status: DC

## 2014-04-16 MED ORDER — DIBUCAINE 1 % RE OINT
1.0000 "application " | TOPICAL_OINTMENT | RECTAL | Status: DC | PRN
  Administered 2014-04-16: 1 via RECTAL
  Filled 2014-04-16: qty 28

## 2014-04-16 MED ORDER — ONDANSETRON HCL 4 MG PO TABS: 4.0000 mg | ORAL_TABLET | ORAL | Status: DC | PRN

## 2014-04-16 MED ORDER — ZOLPIDEM TARTRATE 5 MG PO TABS: 5.0000 mg | ORAL_TABLET | Freq: Every evening | ORAL | Status: DC | PRN

## 2014-04-16 MED ORDER — FERROUS SULFATE 325 (65 FE) MG PO TABS
325.0000 mg | ORAL_TABLET | Freq: Two times a day (BID) | ORAL | Status: DC
  Administered 2014-04-17 – 2014-04-18 (×4): 325 mg via ORAL
  Filled 2014-04-16 (×4): qty 1

## 2014-04-16 MED ORDER — LIDOCAINE HCL (PF) 1 % IJ SOLN
INTRAMUSCULAR | Status: AC
  Filled 2014-04-16: qty 30

## 2014-04-16 MED ORDER — CITRIC ACID-SODIUM CITRATE 334-500 MG/5ML PO SOLN: 30.0000 mL | ORAL | Status: DC | PRN

## 2014-04-16 MED ORDER — BENZOCAINE-MENTHOL 20-0.5 % EX AERO
1.0000 "application " | INHALATION_SPRAY | CUTANEOUS | Status: DC | PRN
  Administered 2014-04-16: 1 via TOPICAL
  Filled 2014-04-16: qty 56

## 2014-04-16 MED ORDER — MEASLES, MUMPS & RUBELLA VAC ~~LOC~~ INJ: 0.5000 mL | INJECTION | Freq: Once | SUBCUTANEOUS | Status: DC

## 2014-04-16 MED ORDER — FENTANYL CITRATE 0.05 MG/ML IJ SOLN
100.0000 ug | INTRAMUSCULAR | Status: DC | PRN
  Administered 2014-04-16: 100 ug via INTRAVENOUS
  Filled 2014-04-16: qty 2

## 2014-04-16 MED ORDER — MISOPROSTOL 200 MCG PO TABS
ORAL_TABLET | ORAL | Status: AC
  Filled 2014-04-16: qty 4

## 2014-04-16 MED ORDER — OXYCODONE-ACETAMINOPHEN 5-325 MG PO TABS: 2.0000 | ORAL_TABLET | ORAL | Status: DC | PRN

## 2014-04-16 MED ORDER — OXYTOCIN 40 UNITS IN LACTATED RINGERS INFUSION - SIMPLE MED
1.0000 m[IU]/min | INTRAVENOUS | Status: DC
  Administered 2014-04-16: 1 m[IU]/min via INTRAVENOUS

## 2014-04-16 MED ORDER — LACTATED RINGERS IV SOLN: INTRAVENOUS | Status: DC

## 2014-04-16 MED ORDER — ONDANSETRON HCL 4 MG/2ML IJ SOLN: 4.0000 mg | Freq: Four times a day (QID) | INTRAMUSCULAR | Status: DC | PRN

## 2014-04-16 MED ORDER — LANOLIN HYDROUS EX OINT: 1.0000 "application " | TOPICAL_OINTMENT | CUTANEOUS | Status: DC | PRN

## 2014-04-16 MED ORDER — LIDOCAINE HCL (PF) 1 % IJ SOLN
30.0000 mL | INTRAMUSCULAR | Status: AC | PRN
  Administered 2014-04-16: 30 mL via SUBCUTANEOUS
  Filled 2014-04-16: qty 30

## 2014-04-16 MED ORDER — TERBUTALINE SULFATE 1 MG/ML IJ SOLN: 0.2500 mg | Freq: Once | INTRAMUSCULAR | Status: DC | PRN

## 2014-04-16 MED ORDER — ONDANSETRON HCL 4 MG/2ML IJ SOLN: 4.0000 mg | INTRAMUSCULAR | Status: DC | PRN

## 2014-04-16 MED ORDER — SIMETHICONE 80 MG PO CHEW: 80.0000 mg | CHEWABLE_TABLET | ORAL | Status: DC | PRN

## 2014-04-16 MED ORDER — PRENATAL MULTIVITAMIN CH
1.0000 | ORAL_TABLET | Freq: Every day | ORAL | Status: DC
  Administered 2014-04-17 – 2014-04-18 (×2): 1 via ORAL
  Filled 2014-04-16 (×2): qty 1

## 2014-04-16 MED ORDER — WITCH HAZEL-GLYCERIN EX PADS
1.0000 "application " | MEDICATED_PAD | CUTANEOUS | Status: DC | PRN
  Administered 2014-04-16: 1 via TOPICAL

## 2014-04-16 MED ORDER — OXYTOCIN 40 UNITS IN LACTATED RINGERS INFUSION - SIMPLE MED: 62.5000 mL/h | INTRAVENOUS | Status: DC

## 2014-04-16 MED ORDER — LACTATED RINGERS IV SOLN: 500.0000 mL | INTRAVENOUS | Status: DC | PRN

## 2014-04-16 MED ORDER — SENNOSIDES-DOCUSATE SODIUM 8.6-50 MG PO TABS
2.0000 | ORAL_TABLET | ORAL | Status: DC
  Administered 2014-04-16 – 2014-04-17 (×2): 2 via ORAL
  Filled 2014-04-16 (×2): qty 2

## 2014-04-16 MED ORDER — OXYTOCIN 40 UNITS IN LACTATED RINGERS INFUSION - SIMPLE MED
INTRAVENOUS | Status: AC
  Filled 2014-04-16: qty 1000

## 2014-04-16 MED ORDER — MISOPROSTOL 200 MCG PO TABS
800.0000 ug | ORAL_TABLET | Freq: Once | ORAL | Status: AC
  Administered 2014-04-16: 800 ug via VAGINAL

## 2014-04-16 MED ORDER — TETANUS-DIPHTH-ACELL PERTUSSIS 5-2.5-18.5 LF-MCG/0.5 IM SUSP
0.5000 mL | Freq: Once | INTRAMUSCULAR | Status: AC
  Administered 2014-04-17: 0.5 mL via INTRAMUSCULAR

## 2014-04-16 MED ORDER — ACETAMINOPHEN 325 MG PO TABS: 650.0000 mg | ORAL_TABLET | ORAL | Status: DC | PRN

## 2014-04-16 MED ORDER — IBUPROFEN 600 MG PO TABS
600.0000 mg | ORAL_TABLET | Freq: Four times a day (QID) | ORAL | Status: DC
  Administered 2014-04-16 – 2014-04-18 (×8): 600 mg via ORAL
  Filled 2014-04-16 (×8): qty 1

## 2014-04-16 MED ORDER — DIPHENHYDRAMINE HCL 25 MG PO CAPS: 25.0000 mg | ORAL_CAPSULE | Freq: Four times a day (QID) | ORAL | Status: DC | PRN

## 2014-04-16 NOTE — Progress Notes (Signed)
Assisted RN with interpretation of patient transfer to MB room and ordering dinner and breakfast of patient.  Lauren Barnes - [place] [employer] Interpre[employer]

## 2014-04-16 NOTE — MAU Note (Signed)
Contractions have gotten worse since yesterday.  Denies LOF, some VB.

## 2014-04-16 NOTE — Progress Notes (Signed)
Patient ID: Lauren Barnes, female   DOB: 01-Nov-1985, 28 y.o.   MRN: [id number] Lauren Barnes is a 28 y.o. G2P1001 at 2959w6d.  Subjective: Requesting IV pain meds.   Objective: BP 124/63  Pulse 83  Temp(Src) 98.2 F (36.8 C) (Oral)  Resp 18  Ht 5\' 1"  (1.549 m)  Wt 91.173 kg (201 lb)  BMI 38.00 kg/m2   FHT:  FHR: 145 bpm, variability: mod,  accelerations:  15x15x,  decelerations:  none UC:   Q 3-5 minutes, strong  Dilation: 8.5 Effacement (%): 100 Cervical Position: Middle Station: -2 Presentation: Vertex Exam by:: AMariel Sleet. Gagliardo, RN/Ginger Morris, RN  Labs: Results for orders placed during the hospital encounter of 04/16/14 (from the past 24 hour(s))  OB RESULTS CONSOLE GBS     Status: None   Collection Time    04/16/14 12:00 AM      Result Value Ref Range   GBS Negative    GROUP B STREP BY PCR     Status: None   Collection Time    04/16/14 12:24 PM      Result Value Ref Range   Group B strep by PCR NEGATIVE  NEGATIVE  CBC     Status: Abnormal   Collection Time    04/16/14  1:00 PM      Result Value Ref Range   WBC 12.4 (*) 4.0 - 10.5 K/uL   RBC 4.74  3.87 - 5.11 MIL/uL   Hemoglobin 13.9  12.0 - 15.0 g/dL   HCT 69.640.4  29.536.0 - 28.446.0 %   MCV 85.2  78.0 - 100.0 fL   MCH 29.3  26.0 - 34.0 pg   MCHC 34.4  30.0 - 36.0 g/dL   RDW 13.214.0  44.011.5 - 10.215.5 %   Platelets 252  150 - 400 K/uL  POCT FERN TEST     Status: None   Collection Time    04/16/14  1:56 PM      Result Value Ref Range   POCT Fern Test Positive = ruptured amniotic membanes      Assessment / Plan: 1959w6d week IUP Labor: transition Fetal Wellbeing:  Category I Pain Control:  Fentanyl Anticipated MOD:  NSVD  Dorathy KinsmanVirginia Zabdi Mis, CNM 04/16/2014 2:41 PM

## 2014-04-16 NOTE — Progress Notes (Signed)
Assisted RN and Midwife with interpretation during delivery.  Spanish Interpreter - Joselyn GlassmanBenita Barnes

## 2014-04-16 NOTE — Lactation Note (Signed)
This note was copied from the chart of Boy Carmel SacramentoSandra Cardoza-Zapata. Lactation Consultation Note Initial visit at 5 hours of age with spanish interpreter. Baby is latched with wide flanged lips and audible frequent swallows.  Mom denies pain.  [facility] given and discussed.  Encouraged to feed with early cues on demand.  Early newborn behavior discussed.  Hand expression demonstrated with colostrum visible.  Mom to call for assist as needed.     Patient Name: Boy Carmel SacramentoSandra Cardoza-Zapata [id number]'XToday's Date: 04/16/2014 Reason for consult: Initial assessment   Maternal Data Has patient been taught Hand Expression?: Yes Does the patient have breastfeeding experience prior to this delivery?: Yes  Feeding Feeding Type: Breast Fed Length of feed:  (observed 10 minutes)  LATCH Score/Interventions Latch: Grasps breast easily, tongue down, lips flanged, rhythmical sucking. Intervention(s): Adjust position  Audible Swallowing: Spontaneous and intermittent Intervention(s): Skin to skin;Hand expression  Type of Nipple: Everted at rest and after stimulation  Comfort (Breast/Nipple): Soft / non-tender     Hold (Positioning): No assistance needed to correctly position infant at breast. Intervention(s): Skin to skin;Position options;Support Pillows;Breastfeeding basics reviewed  LATCH Score: 10  Lactation Tools Discussed/Used     Consult Status Consult Status: Follow-up Date: 04/17/14 Follow-up type: In-patient    Shoptaw, Arvella MerlesJana Lynn 04/16/2014, 10:39 PM

## 2014-04-16 NOTE — Progress Notes (Signed)
Assisted RN with interpretation of postpartum care and care of baby.  Assisted in ordering patient a late snack.  Lauren Barnes - [place] [employer]

## 2014-04-16 NOTE — H&P (Signed)
HPI: Lauren Barnes is a 28 y.o. year old 592P1001 female at 349w6d weeks gestation by LMP verified by 20 week US who presents to MAU reporting Labor. Care ay [facility]. + ETOH in pregnancy. Denies LOF or VB, but water broke while in MAU.   Previous LTCS 2012 for Breech. Plans TOLAC. Consent under media tab.     Maternal Medical History:  Reason for admission: Contractions.     OB History   Grav Para Term Preterm Abortions TAB SAB Ect Mult Living   2 1 1       1      Past Medical History  Diagnosis Date  . Medical history non-contributory    Past Surgical History  Procedure Laterality Date  . Cesarean section    . Cholecystectomy     Family History: family history is not on file. Social History:  reports that she has never smoked. She has never used smokeless tobacco. She reports that she does not drink alcohol or use illicit drugs.   Prenatal Transfer Tool  Maternal Diabetes: No Genetic Screening: Normal Maternal Ultrasounds/Referrals: Normal Fetal Ultrasounds or other Referrals:  None Maternal Substance Abuse:  Yes:  Type: Other:  Significant Maternal Medications:  None Significant Maternal Lab Results:  Lab values include: Group B Strep negative Other Comments:  ETOH pos in pregnancy  Review of Systems  Constitutional: Negative for fever and chills.  Eyes: Negative for blurred vision and double vision.  Gastrointestinal: Positive for abdominal pain (contractions). Negative for vomiting.  Neurological: Negative for headaches.    Dilation: 9 Effacement (%): 100 Station: 0 Exam by:: Ladye Macnaughton cnm Blood pressure 135/75, pulse 113, temperature 98.2 F (36.8 C), temperature source Oral, resp. rate 18, height 5\' 1"  (1.549 m), weight 91.173 kg (201 lb). Maternal Exam:  Uterine Assessment: Contraction strength is moderate.  Contraction frequency is regular.   Abdomen: Surgical scars: low transverse.   Estimated fetal weight is 8-8.   Fetal presentation:  breech  Introitus: Normal vulva. Normal vagina.  Ferning test: positive.  Amniotic fluid character: clear.  Pelvis: adequate for delivery.   Cervix: Cervix evaluated by digital exam.     Fetal Exam Fetal Monitor Review: Mode: ultrasound.   Baseline rate: 145.  Variability: moderate (6-25 bpm).   Pattern: accelerations present and no decelerations.    Fetal State Assessment: Category I - tracings are normal.     Physical Exam  Nursing note and vitals reviewed. Constitutional: She is oriented to person, place, and time. She appears well-developed and well-nourished. She appears distressed.  HENT:  Head: Normocephalic.  Eyes: Conjunctivae are normal.  Cardiovascular: Normal rate, regular rhythm and normal heart sounds.   Respiratory: Effort normal and breath sounds normal.  GI: Soft. There is no tenderness.  Genitourinary: Vagina normal.  Musculoskeletal: Normal range of motion. She exhibits no edema and no tenderness.  Neurological: She is alert and oriented to person, place, and time. She has normal reflexes.  Skin: Skin is warm and dry.  Psychiatric: She has a normal mood and affect.    Prenatal labs: ABO, Rh:  O pos Antibody:  Neg Rubella:  Immune RPR:   NR HBsAg:   Neg HIV:   NR GBS: Negative (10/04 0000)  Early 1 hour GTT 116 1 hour GTT 95  Assessment: 1. Labor: Active 2. Fetal Wellbeing: Category I  3. Pain Control: None 4. GBS: Neg 5. 39.6 week IUP 6. TOLAC--Consent under media tab  Plan:  1. Admit to BS per consult  with MD 2. Routine L&D orders 3. Analgesia/anesthesia PRN   Dorathy Kinsman 04/16/2014, 4:00 PM

## 2014-04-16 NOTE — Progress Notes (Signed)
Assisted Lactation with interpretation of consultation.  Lauren Barnes - [place] [employer]

## 2014-04-17 MED ORDER — INFLUENZA VAC SPLIT QUAD 0.5 ML IM SUSY
0.5000 mL | PREFILLED_SYRINGE | INTRAMUSCULAR | Status: AC
  Administered 2014-04-18: 0.5 mL via INTRAMUSCULAR
  Filled 2014-04-17: qty 0.5

## 2014-04-17 NOTE — Progress Notes (Signed)
[facility] with questions. Lauren Barnes  Interpreter.

## 2014-04-17 NOTE — Progress Notes (Signed)
UR chart review completed.  

## 2014-04-17 NOTE — Lactation Note (Signed)
This note was copied from the chart of Boy Carmel SacramentoSandra Cardoza-Zapata. Lactation Consultation Note  Patient Name: Boy Carmel SacramentoSandra Cardoza-Zapata [id number]'UToday's Date: 04/17/2014 Reason for consult: Follow-up assessment;Breast/nipple pain (RN reports irritation of nipples with erythema) RN requested and was provided with comfort gelpads and will provide instructions in their use when Spanish interpreter returns to bedside.  Mom also asked about trying a "nipple [facility]" [facility] sore [facility].  LC reviewed option with RN, Jeanice LimHolly and she will assess at next feeding and provide if needed.   Maternal Data    Feeding Feeding Type: Breast Fed Length of feed: 15 min  LATCH Score/Interventions          Comfort (Breast/Nipple): Filling, red/small blisters or bruises, mild/mod discomfort  Problem noted: Mild/Moderate discomfort Interventions (Mild/moderate discomfort): Comfort gels        Lactation Tools Discussed/Used Tools: Comfort gels   Consult Status Consult Status: Follow-up Date: 04/18/14 Follow-up type: In-patient    Warrick ParisianBryant, Treyshawn Muldrew [facility] 04/17/2014, 10:03 PM

## 2014-04-17 NOTE — Progress Notes (Signed)
CSW acknowledged consult for etoh use during pregnancy.   CSW completed chart review and noted that MOB was positive for etoh only at initial prenatal visit.  It is documented that she received counseling on abstaining from etoh during her pregnancy, and that there were no further reports of her etoh use during pregnancy.  CSW to screen out referral.   Please notify CSW with additional concerns or as needs arise.

## 2014-04-17 NOTE — Lactation Note (Signed)
This note was copied from the chart of Boy Carmel SacramentoSandra Cardoza-Zapata. Lactation Consultation Note: Follow up visit with Eda interpreting for me. Experienced BF mom. Reports pain with nursing. Baby asleep at present. Mom to call at next feeding for assist.   Patient Name: Boy Carmel SacramentoSandra Cardoza-Zapata [id number]'YToday's Date: 04/17/2014 Reason for consult: Follow-up assessment   Maternal Data Formula Feeding for Exclusion: No Does the patient have breastfeeding experience prior to this delivery?: Yes  Feeding Feeding Type: Breast Fed  LATCH Score/Interventions                      Lactation Tools Discussed/Used     Consult Status Consult Status: Follow-up Date: 04/17/14 Follow-up type: In-patient    Pamelia HoitWeeks, Samul Mcinroy D 04/17/2014, 9:45 AM

## 2014-04-17 NOTE — H&P (Signed)
Attestation of Attending Supervision of Advanced Practitioner (CNM/NP): Evaluation and management procedures were performed by the Advanced Practitioner under my supervision and collaboration.  I have reviewed the Advanced Practitioner's note and chart, and I agree with the management and plan.  Ivey Nembhard 04/17/2014 2:35 AM   

## 2014-04-17 NOTE — Progress Notes (Signed)
Post Partum Day 1 Subjective: no complaints, up ad lib, voiding, tolerating PO and + flatus  Objective: Blood pressure 108/64, pulse 83, temperature 98.2 F (36.8 C), temperature source Oral, resp. rate 18, height 5\' 1"  (1.549 m), weight 91.173 kg (201 lb), unknown if currently breastfeeding.  Physical Exam:  General: alert, cooperative and appears stated age Lochia: appropriate Uterine Fundus: firm DVT Evaluation: No evidence of DVT seen on physical exam. No cords or calf tenderness. No significant calf/ankle edema.   Recent Labs  04/16/14 1300  HGB 13.9  HCT 40.4    Assessment/Plan: Plan for discharge tomorrow, Breastfeeding and Contraception Paraguard No circumsicion   LOS: 1 day   Rosemary HolmsHaller, Nicolas 04/17/2014, 7:49 AM   I examined pt and agree with documentation above and PA-S plan of care. Eino FarberWalidah Kennith GainN Karim, CNM

## 2014-04-18 MED ORDER — DOCUSATE SODIUM 100 MG PO CAPS: 100.0000 mg | ORAL_CAPSULE | Freq: Two times a day (BID) | ORAL | Status: DC

## 2014-04-18 NOTE — Progress Notes (Signed)
Stopped by earlier to order lunch for the patient and check on her needs. Eda H Royal Interpreter.

## 2014-04-18 NOTE — Lactation Note (Signed)
This note was copied from the chart of Lauren Barnes. Lactation Consultation Note  Patient Name: Lauren Barnes ZOXWR'UToday's Date: 04/18/2014 Reason for consult: Follow-up assessment Eda Royal Spanish interpreter present.  Per mom breast feeding is going well , alittle tender to start , and then better , breast are Feeling heavier. LC reviewed sore nipple and and engorgement prevention tx , and reviewed  with mom the use comfort gels , and hand pump. Mother informed of post-discharge support and  given phone number to the lactation department, including services for phone call assistance; out-patient appointments;  and breastfeeding support group. List of other breastfeeding resources in the community given in the handout. Encouraged  mother to call for problems or concerns related to breastfeeding.    Maternal Data    Feeding Feeding Type:  (per MBU RN, last fed at 0930 )  LATCH Score/Interventions                Intervention(s): Breastfeeding basics reviewed     Lactation Tools Discussed/Used Tools: Pump;Comfort gels Breast pump type: Manual WIC Program: Yes Pump Review:  (already to set up )   Consult Status Consult Status: Complete Date: 04/18/14    Kathrin Greathouseorio, Danuel Felicetti Ann 04/18/2014, 11:16 AM

## 2014-04-18 NOTE — Discharge Instructions (Signed)
Parto vaginal despus de Eustace Quail (Vaginal Birth After Cesarean Delivery) Un parto vaginal despus de un parto por cesrea es dar a luz por la vagina despus de haber dado a luz por medio de una intervencin [place]. En el pasado, si una mujer tena un beb por cesrea, todos los partos posteriores deban hacerse por cesrea. Esto ya no es as. Puede ser seguro para la mam intentar un parto vaginal luego de una cesrea.  Es importante que converse con su mdico desde comienzos del Psychiatrist de modo que pueda [employer], beneficios y opciones. Le dar tiempo para decidir qu es lo mejor en su caso particular. La decisin final de tener un parto vaginal o por cesrea debe tomarse en conjunto, entre usted y el mdico. Cualquier cambio en su salud o la de su beb durante el embarazo puede ser motivo de un cambio de decisin respecto del parto vaginal.  LAS MUJERES QUE OPTAN POR EL PARTO VAGINAL, DEBEN CONSULTAR AL MDICO PARA ASEGURARSE DE QUE:  La cesrea anterior se haya realizado con un corte (incisin) uterino transversal (no con una incisin vertical clsica).  El canal de parto es lo suficientemente grande como para que pase el [place].  No ha sido sometida a otras operaciones del tero.  Durante el trabajo de parto, le realizarn un monitoreo fetal Forensic scientist, en todo momento.  Habr un quirfano disponible y listo en caso de necesitar una cesrea de emergencia.  Un mdico y personal de quirfano estarn disponibles en todo momento durante el [place] de parto, para realizar una cesrea en caso de ser necesario.  Habr un anestesista disponible en caso de necesitar una cesrea de emergencia.  La nursery est lista y cuenta con personal especializado y el equipo disponible para cuidar al beb en caso de emergencia. BENEFICIOS DEL PARTO VAGINAL:  Permanencia ms breve en el hospital.  Prevencin de los riesgos asociados con el parto por cesrea, por ejemplo:  Complicaciones  quirrgicas, como apertura o hernia de la incisin.  Lesiones en otros rganos.  Grant Ruts. Esto puede ocurrir si aparece una infeccin despus de la ciruga. Tambin puede ocurrir como reaccin a los medicamentos administrados para adormecerla durante la [place].  Menos prdida de sangre y menos probabilidad de necesitar una transfusin sangunea.  Menor riesgo de cogulos sanguneos e infeccin.  Tiempo ms corto de recuperacin.  Menor riesgo de remocin del tero (histerectoma).  Menor riesgo de que la placenta cubra parcial o completamente la abertura del tero (placenta previa) en embarazos futuros.  Menos riesgos en el [place] de parto y [facility] futuros. RIESGOS  Ruptura del tero. Esto ocurre en menos del 1% de los partos vaginales. El riesgo de que eso suceda es mayor si:  Se toman medidas para iniciar el proceso del [place] de parto (inducir Engineer, manufacturing systems) o Risk manager o intensificar las contracciones (aumentar el trabajo de [place]).  Se usan medicamentos para ablandar (madurar) el cuello del tero.  Es necesario extraer el tero (histerectoma) si se rompe. No debe llevarse a cabo si:  La cesrea previa se realiz con una incisin vertical (clsica) o con forma de T, o usted no sabe cul de [employer] han practicado.  Ha sufrido ruptura del tero.  Ha tenido ciertos tipos de Leisure centre manager en el tero, como la extirpacin de fibromas uterinos. Pregntele a su mdico sobre otros tipos de cirugas que le impiden tener un parto vaginal.  Tiene ciertos problemas mdicos o relacionados con el parto (obsttricos).  El beb est en  problemas.  Tuvo dos cesreas previas y ningn parto vaginal. OTRAS COSAS QUE DEBE SABER:  La anestesia peridural es segura.  Es seguro dar vuelta al beb si se encuentra de nalgas (intentar una versin ceflica externa).  Es seguro intentarlo en caso de mellizos.  El parto vaginal puede no ser apropiado si el beb pesa 8,8lb (4kg) o ms. Sin embargo,  las predicciones de [place] no son siempre exactas y no deben ser lo nico a tenerse en cuenta para decidir si el parto vaginal es lo indicado para usted.  Hay aumento en el porcentaje de fracasos si el intervalo entre la cesrea y el parto vaginal es de menos de 19 meses.  Su mdico puede aconsejarle no tener un parto vaginal si tiene preeclampsia (hipertensin, protena en la orina e hinchazn en la cara y las extremidades).  El parto vaginal suele ser exitoso si ya tuvo un parto vaginal previamente.  Tambin suele ser exitoso cuando el trabajo de parto comienza espontneamente antes de la fecha.  El parto vaginal despus de Eustace Quailuna cesrea es similar a un parto espontneo vaginal normal. Document Released: 12/17/2007 Document Revised: 04/20/2013 ExitCare Patient Information 2015 [place], [place]. This information is not intended to replace advice given to you by your health care provider. Make sure you discuss any questions you have with your health care provider.   Estreimiento (Constipation) Se llama constipacin cuando:  Elimina heces (defeca) menos de 3 veces por semana.  Tiene dificultad para defecar.  Las heces son secas y duras o son ms grandes que lo normal. CUIDADOS EN EL HOGAR   Consuma alimentos con alto contenido de [place]. Por ejemplo, frutas, vegetales, porotos y cereales integrales, como el arroz integral.  Evite los alimentos ricos en grasas y International aid/development workerazcar. Estos incluyen patatas fritas, hamburguesas, galletas, dulces y refrescos.  Si no consume suficientes alimentos ricos en fibras, tome productos que tengan agregado de fibra (suplementos).  Beba suficiente lquido para mantener el pis (orina) claro o de color amarillo plido.  Haga ejercicio en forma regular, o como lo indique su mdico.  Vaya al bao cuando sienta la necesidad de Advertising copywriterdefecar. No se aguante las ganas.  Solo tome los medicamentos que le haya indicado su mdico. No tome medicamentos que le ayuden a Advertising copywriterdefecar  (laxantes) sin antes consultarlo con su mdico. SOLICITE AYUDA DE INMEDIATO SI:   Observa sangre brillante en las heces (materia fecal).  El estreimiento dura ms de 4 das o [place]empeora.  Tiene dolor en el vientre (abdominal) o el trasero (recto).  Las heces son delgadas (como un lpiz).  Pierde peso de [place] inexplicable. ASEGRESE DE QUE:   Comprende estas instrucciones.  Controlar su afeccin.  Recibir ayuda de inmediato si no mejora o si empeora. Document Released: 08/02/2010 Document Revised: 07/05/2013 [facility]Exit[facility] Patient Information 2015 [place]Care, MarylandLLC. This information is not intended to replace advice given to you by your health care provider. Make sure you discuss any questions you have with your health care provider.

## 2014-04-18 NOTE — Progress Notes (Signed)
Stopped by to check on patient's needs. Eda H Royal Interpreter. °

## 2014-04-18 NOTE — Discharge Summary (Signed)
Obstetric Discharge Summary Reason for Admission: onset of labor Prenatal Procedures: none Intrapartum Procedures: spontaneous vaginal delivery Postpartum Procedures: none Complications-Operative and Postpartum: 2nd degree perineal laceration Hemoglobin  Date Value Ref Range Status  04/16/2014 13.9  12.0 - 15.0 g/dL Final     HCT  Date Value Ref Range Status  04/16/2014 40.4  36.0 - 46.0 % Final    Physical Exam:  General: alert, cooperative and appears stated age 32Lochia: appropriate Uterine Fundus: firm DVT Evaluation: No evidence of DVT seen on physical exam.  Discharge Diagnoses: Term Pregnancy-delivered  Discharge Information: Date: 04/18/2014 Activity: pelvic rest Diet: routine Medications: Colace Condition: improved Instructions: refer to practice specific booklet Discharge to: home   Newborn Data: Live born female  Birth Weight: 9 lb 0.3 oz (4090 g) APGAR: 9, 9  Home with husband.  Rosemary HolmsHaller, Nicolas 04/18/2014, 7:49 AM  OB fellow attestation Post Partum Day 2 I have seen and examined this patient and agree with above documentation in the PA student's note.   Maxwell MarionSandra E Cardoza-Zapata is a 28 y.o. [id number] s/p VBAC.  Pt denies problems with ambulating, voiding or po intake. Pain is well controlled.  Plan for birth control is paraguard.  Method of Feeding: breast.  Would like something for constipation  PE:  BP 98/57  Pulse 75  Temp(Src) 97.9 F (36.6 C) (Oral)  Resp 18  Ht 5\' 1"  (1.549 m)  Wt 201 lb (91.173 kg)  BMI 38.00 kg/m2  Breastfeeding? Unknown Fundus firm  Plan for discharge: today, colace for constipation  Perry MountACOSTA,Geneveive Furness ROCIO, MD 11:53 AM

## 2014-05-15 ENCOUNTER — Encounter (HOSPITAL_COMMUNITY): Payer: Self-pay | Admitting: *Deleted

## 2018-05-09 ENCOUNTER — Other Ambulatory Visit: Payer: Self-pay

## 2018-05-09 ENCOUNTER — Emergency Department (HOSPITAL_COMMUNITY): Payer: Self-pay

## 2018-05-09 ENCOUNTER — Emergency Department (HOSPITAL_COMMUNITY)
Admission: EM | Admit: 2018-05-09 | Discharge: 2018-05-09 | Disposition: A | Discharge status: Home / Self Care | Payer: Self-pay | Attending: Emergency Medicine | Admitting: Emergency Medicine

## 2018-05-09 DIAGNOSIS — M5489 Other dorsalgia: Secondary | ICD-10-CM | POA: Insufficient documentation

## 2018-05-09 DIAGNOSIS — G8929 Other chronic pain: Secondary | ICD-10-CM | POA: Insufficient documentation

## 2018-05-09 DIAGNOSIS — R58 Hemorrhage, not elsewhere classified: Secondary | ICD-10-CM

## 2018-05-09 DIAGNOSIS — M549 Dorsalgia, unspecified: Secondary | ICD-10-CM

## 2018-05-09 DIAGNOSIS — Z79899 Other long term (current) drug therapy: Secondary | ICD-10-CM | POA: Insufficient documentation

## 2018-05-09 LAB — BASIC METABOLIC PANEL
Anion gap: 6 (ref 5–15)
BUN: 12 mg/dL (ref 6–20)
CO2: 24 mmol/L (ref 22–32)
CREATININE: 0.73 mg/dL (ref 0.44–1.00)
Calcium: 9.1 mg/dL (ref 8.9–10.3)
Chloride: 108 mmol/L (ref 98–111)
Glucose, Bld: 88 mg/dL (ref 70–99)
Potassium: 4.7 mmol/L (ref 3.5–5.1)
Sodium: 138 mmol/L (ref 135–145)

## 2018-05-09 LAB — CBC
HCT: 40.8 % (ref 36.0–46.0)
Hemoglobin: 12.9 g/dL (ref 12.0–15.0)
MCH: 28.1 pg (ref 26.0–34.0)
MCHC: 31.6 g/dL (ref 30.0–36.0)
MCV: 88.9 fL (ref 80.0–100.0)
NRBC: 0 % (ref 0.0–0.2)
Platelets: 311 10*3/uL (ref 150–400)
RBC: 4.59 MIL/uL (ref 3.87–5.11)
RDW: 13.5 % (ref 11.5–15.5)
WBC: 9.5 10*3/uL (ref 4.0–10.5)

## 2018-05-09 LAB — I-STAT BETA HCG BLOOD, ED (MC, WL, AP ONLY): I-stat hCG, quantitative: <5 m[IU]/mL (ref <5)

## 2018-05-09 MED ORDER — METHOCARBAMOL 750 MG PO TABS: 750.0000 mg | ORAL_TABLET | Freq: Three times a day (TID) | ORAL | 0 refills | Status: DC | PRN

## 2018-05-09 NOTE — Discharge Instructions (Signed)
Please take Ibuprofen (Advil, motrin) and Tylenol (acetaminophen) to relieve your pain.  You may take up to 600 MG (3 pills) of normal strength ibuprofen every 8 hours as needed.  In between doses of ibuprofen you make take tylenol, up to 1,000 mg (two extra strength pills).  Do not take more than 3,000 mg tylenol in a 24 hour period.  Please check all medication labels as many medications such as pain and cold medications may contain tylenol.  Do not drink alcohol while taking these medications.  Do not take other NSAID'S while taking ibuprofen (such as aleve or naproxen).  Please take ibuprofen with food to decrease stomach upset.  You are being prescribed a medication which may make you sleepy. For 24 hours after one dose please do not drive, operate heavy machinery, care for a small child with out another adult present, or perform any activities that may cause harm to you or someone else if you were to fall asleep or be impaired.   The best way to get rid of muscle pain is by taking NSAIDS, using heat, massage therapy, and gentle stretching/range of motion exercises.  

## 2018-05-09 NOTE — ED Triage Notes (Signed)
Patient to ER for 4 months of right mid lateral back pain, worsened by deep breathing, denies abdominal pain. Patient has reddened spot to area of pain, reports feels it is swollen. Denies nausea, vomiting, diarrhea. Denies urinary difficulty.

## 2018-05-09 NOTE — ED Provider Notes (Signed)
[facility] EMERGENCY DEPARTMENT Provider Note   CSN: [id number] Arrival date & time: 05/09/18  1407     History   Chief Complaint Chief Complaint  Patient presents with  . Back Pain  . Shortness of Breath    HPI Lauren Barnes is a 31 y.o. female back and flank pain for 4 months.  She hurts from her shoulder all the way down to her hip on the right side.  She denies any anterior chest pain or shortness of breath.  Her pain is worse with deep breathing and lifting arms up.  She has a hard red area where it hurts.   No dysuria, increased frequency or urgency.  No hematuria.  Of note patient reports that she had worsening pain early this week, and therefore got a massage.  She reports that they used cupping treatment during the massage.  Denies any specific trauma.  History is obtained by patient and family.  She gave consent for family member to serve as Nurse, learning disability as needed.  HPI  Past Medical History:  Diagnosis Date  . Medical history non-contributory     Patient Active Problem List   Diagnosis Date Noted  . Active labor 04/16/2014  . VBAC, delivered 04/16/2014    Past Surgical History:  Procedure Laterality Date  . CESAREAN SECTION    . CHOLECYSTECTOMY       OB History    Gravida  2   Para  2   Term  2   Preterm      AB      Living  2     SAB      TAB      Ectopic      Multiple      Live Births  2            Home Medications    Prior to Admission medications   Medication Sig Start Date End Date Taking? Authorizing Provider  docusate sodium (COLACE) 100 MG capsule Take 1 capsule (100 mg total) by mouth 2 (two) times daily. 04/18/14   Fredirick Lathe, MD  methocarbamol (ROBAXIN) 750 MG tablet Take 1-2 tablets (750-1,500 mg total) by mouth 3 (three) times daily as needed for muscle spasms. 05/09/18   Cristina Gong, PA-C  Prenatal Vit-Fe Fumarate-FA (PRENATAL MULTIVITAMIN) TABS tablet Take 1 tablet by mouth  daily at 12 noon.    [provider]    Family History No family history on file.  Social History Social History   Tobacco Use  . Smoking status: Never Smoker  . Smokeless tobacco: Never Used  Substance Use Topics  . Alcohol use: No  . Drug use: No     Allergies   Patient has no known allergies.   Review of Systems Review of Systems  Constitutional: Negative for chills and fever.  Respiratory: Negative for chest tightness and shortness of breath.   Cardiovascular: Negative for chest pain.  Genitourinary: Negative for dysuria, frequency, hematuria and urgency.  Musculoskeletal: Positive for back pain (Right sided from shoulder to hip).  Skin: Positive for color change.  Neurological: Negative for headaches.  All other systems reviewed and are negative.    Physical Exam Updated Vital Signs BP 128/85   Pulse 73   Temp 98.4 F (36.9 C) (Oral)   Resp 16   Wt 75.8 kg   LMP 05/02/2018   SpO2 99%   BMI 31.55 kg/m   Physical Exam  Constitutional: She is oriented to  person, place, and time. She appears well-developed and well-nourished.  Non-toxic appearance. No distress.  HENT:  Head: Normocephalic and atraumatic.  Eyes: Conjunctivae are normal.  Neck: Neck supple.  Cardiovascular: Normal rate and regular rhythm.  No murmur heard. Pulmonary/Chest: Effort normal and breath sounds normal. No accessory muscle usage. No tachypnea and no bradypnea. No respiratory distress. She has no decreased breath sounds.  Abdominal: Soft. Bowel sounds are normal. She exhibits no distension. There is no tenderness.  No CVA tenderness to percussion  Musculoskeletal: She exhibits no edema.  C/T/L-spine palpated without midline tenderness to palpation, step-offs, or deformities.  Is diffuse right-sided back tenderness to palpation from the shoulder to the hip.  Palpation over the this area both re-creates and exacerbates her reported pain.  Neurological: She is alert and  oriented to person, place, and time.  Skin: Skin is warm and dry.  Approximately 6 cm 10 cm area of petechial rash on the right flank.  Area does not blanch.  Psychiatric: She has a normal mood and affect.  Nursing note and vitals reviewed.    ED Treatments / Results  Labs (all labs ordered are listed, but only abnormal results are displayed) Labs Reviewed  BASIC METABOLIC PANEL  CBC  I-STAT BETA HCG BLOOD, ED ([facility], [facility], [facility] ONLY)    EKG None  Radiology Dg Chest 2 View  Result Date: 05/09/2018 CLINICAL DATA:  Pain. EXAM: CHEST - 2 VIEW COMPARISON:  None. FINDINGS: The heart size and mediastinal contours are within normal limits. Both lungs are clear. The visualized skeletal structures are unremarkable. IMPRESSION: No active cardiopulmonary disease. Electronically Signed   By: Gerome Sam III M.D   On: 05/09/2018 17:06    Procedures Procedures (including critical care time)  Medications Ordered in ED Medications - No data to display   Initial Impression / Assessment and Plan / ED Course  I have reviewed the triage vital signs and the nursing notes.  Pertinent labs & imaging results that were available during my care of the patient were reviewed by me and considered in my medical decision making (see chart for details).    Patient presents today for evaluation of 4 months of right-sided back pain that got significantly worse over the past week.  She initially was very concerned about an area of redness on her right flank, however history revealed that she has recently had a massage with cupping treatment.  Ecchymosis/petechial rash is consistent with her reported cupping treatment.   As she reported shortness of breath upon arrival, chest x-ray, hCG, and basic blood work was obtained all of which was without acute abnormalities.  Upon further inquiry she reports that any movement, including breathing hurts only on the right side of her chest.  Patient is not tachycardic or  tachypneic, does not take any birth control has not had any recent immobilizations or surgeries, PERC negative.    Recommended heat, OTC pain medicine, establishing care with PCP.  Return precautions were discussed with patient who states their understanding.  At the time of discharge patient denied any unaddressed complaints or concerns.  Patient is agreeable for discharge home.   Final Clinical Impressions(s) / ED Diagnoses   Final diagnoses:  Chronic right-sided back pain, unspecified back location  Ecchymosis    ED Discharge Orders         Ordered    methocarbamol (ROBAXIN) 750 MG tablet  3 times daily PRN     05/09/18 1810  Cristina Gong, PA-C 05/09/18 1810    Bethann Berkshire, MD 05/09/18 [phone]

## 2018-05-09 NOTE — ED Notes (Signed)
Patient transported to X-ray 

## 2022-01-28 ENCOUNTER — Ambulatory Visit (HOSPITAL_COMMUNITY)
Admission: EM | Admit: 2022-01-28 | Discharge: 2022-01-28 | Disposition: A | Discharge status: Home / Self Care | Payer: Self-pay | Attending: Physician Assistant | Admitting: Physician Assistant

## 2022-01-28 ENCOUNTER — Encounter (HOSPITAL_COMMUNITY): Payer: Self-pay | Admitting: Emergency Medicine

## 2022-01-28 DIAGNOSIS — U071 COVID-19: Secondary | ICD-10-CM

## 2022-01-28 MED ORDER — ALBUTEROL SULFATE HFA 108 (90 BASE) MCG/ACT IN AERS: 1.0000 | INHALATION_SPRAY | Freq: Four times a day (QID) | RESPIRATORY_TRACT | 0 refills | Status: DC | PRN

## 2022-01-28 NOTE — ED Triage Notes (Addendum)
Pt presents to [facility] for a work note. States she took a home covid test today and it was positive. Requesting a work note confirming results. Reports body aches and fever that started yesterday.

## 2022-01-28 NOTE — Discharge Instructions (Signed)
Can use albuterol inhaler every 4-6 hours as needed for wheezing or shortness of breath Can take ibuprofen or tylenol as needed for headache, fever, body aches Can use Mucinex and Flonase for congestion and postnasal drip Drink plenty of fluids, rest.  Stay home 5 days from symptom onset and then wear a mask around others for 5 days.

## 2022-01-28 NOTE — ED Provider Notes (Signed)
[place]    CSN: [id number] Arrival date & time: 01/28/22  1900      History   Chief Complaint Chief Complaint  Patient presents with   Covid Positive   Letter for School/Work    HPI Lauren TIENDA is a 36 y.o. female.   Pt reports she tested positive for COVID on a home test yesterday.  She reports sx started yesterday.  Complains of headache, body aches, congestion, cough.  She has a h/o asthma, reports some wheezing.  She has taken otc cold and cough medicines with temporary relief.  She is here today as her work needs documentation of COVID.     Past Medical History:  Diagnosis Date   Medical history non-contributory     Patient Active Problem List   Diagnosis Date Noted   Active labor 04/16/2014   VBAC, delivered 04/16/2014    Past Surgical History:  Procedure Laterality Date   CESAREAN SECTION     CHOLECYSTECTOMY      OB History     Gravida  2   Para  2   Term  2   Preterm      AB      Living  2      SAB      IAB      Ectopic      Multiple      Live Births  2            Home Medications    Prior to Admission medications   Medication Sig Start Date End Date Taking? Authorizing Provider  albuterol (VENTOLIN HFA) 108 (90 Base) MCG/ACT inhaler Inhale 1-2 puffs into the lungs every 6 (six) hours as needed for wheezing or shortness of breath. 01/28/22  Yes Ward, Lenise Arena, PA-C  docusate sodium (COLACE) 100 MG capsule Take 1 capsule (100 mg total) by mouth 2 (two) times daily. 04/18/14   Nila Nephew, MD  methocarbamol (ROBAXIN) 750 MG tablet Take 1-2 tablets (750-1,500 mg total) by mouth 3 (three) times daily as needed for muscle spasms. 05/09/18   Lorin Glass, PA-C  Prenatal Vit-Fe Fumarate-FA (PRENATAL MULTIVITAMIN) TABS tablet Take 1 tablet by mouth daily at 12 noon.    [provider]    Family History History reviewed. No pertinent family history.  Social History Social History    Tobacco Use   Smoking status: Never   Smokeless tobacco: Never  Substance Use Topics   Alcohol use: No   Drug use: No     Allergies   Patient has no known allergies.   Review of Systems Review of Systems  Constitutional:  Negative for chills and fever.  HENT:  Positive for congestion. Negative for ear pain and sore throat.   Eyes:  Negative for pain and visual disturbance.  Respiratory:  Negative for cough and shortness of breath.   Cardiovascular:  Negative for chest pain and palpitations.  Gastrointestinal:  Negative for abdominal pain and vomiting.  Genitourinary:  Negative for dysuria and hematuria.  Musculoskeletal:  Positive for myalgias. Negative for arthralgias and back pain.  Skin:  Negative for color change and rash.  Neurological:  Positive for headaches. Negative for seizures and syncope.  All other systems reviewed and are negative.    Physical Exam Triage Vital Signs ED Triage Vitals  Enc Vitals Group     BP 01/28/22 1922 120/84     Pulse Rate 01/28/22 1922 95     Resp 01/28/22 1922 18  Temp 01/28/22 1922 99 F (37.2 C)     Temp Source 01/28/22 1922 Oral     SpO2 01/28/22 1922 94 %     Weight --      Height --      Head Circumference --      Peak Flow --      Pain Score 01/28/22 1921 8     Pain Loc --      Pain Edu? --      Excl. in GC? --    No data found.  Updated Vital Signs BP 120/84 (BP Location: Left Arm)   Pulse 95   Temp 99 F (37.2 C) (Oral)   Resp 18   SpO2 94%   Visual Acuity Right Eye Distance:   Left Eye Distance:   Bilateral Distance:    Right Eye Near:   Left Eye Near:    Bilateral Near:     Physical Exam Vitals and nursing note reviewed.  Constitutional:      General: She is not in acute distress.    Appearance: She is well-developed.  HENT:     Head: Normocephalic and atraumatic.  Eyes:     Conjunctiva/sclera: Conjunctivae normal.  Cardiovascular:     Rate and Rhythm: Normal rate and regular rhythm.      Heart sounds: No murmur heard. Pulmonary:     Effort: Pulmonary effort is normal. No respiratory distress.     Breath sounds: Normal breath sounds.  Abdominal:     Palpations: Abdomen is soft.     Tenderness: There is no abdominal tenderness.  Musculoskeletal:        General: No swelling.     Cervical back: Neck supple.  Skin:    General: Skin is warm and dry.     Capillary Refill: Capillary refill takes less than 2 seconds.  Neurological:     Mental Status: She is alert.  Psychiatric:        Mood and Affect: Mood normal.      UC Treatments / Results  Labs (all labs ordered are listed, but only abnormal results are displayed) Labs Reviewed - No data to display  EKG   Radiology No results found.  Procedures Procedures (including critical care time)  Medications Ordered in UC Medications - No data to display  Initial Impression / Assessment and Plan / UC Course  I have reviewed the triage vital signs and the nursing notes.  Pertinent labs & imaging results that were available during my care of the patient were reviewed by me and considered in my medical decision making (see chart for details).     COVID positive on home test.  Work note requested.  Overall well appearing, vitals wnl, lungs clear.  Given h/o asthma will prescribed albuterol inhaler to use as needed.  Return precautions discussed.  Supportive care discussed.  Final Clinical Impressions(s) / UC Diagnoses   Final diagnoses:  COVID     Discharge Instructions      Can use albuterol inhaler every 4-6 hours as needed for wheezing or shortness of breath Can take ibuprofen or tylenol as needed for headache, fever, body aches Can use Mucinex and Flonase for congestion and postnasal drip Drink plenty of fluids, rest.  Stay home 5 days from symptom onset and then wear a mask around others for 5 days.    ED Prescriptions     Medication Sig Dispense Auth. Provider   albuterol (VENTOLIN HFA) 108 (90  Base) MCG/ACT inhaler Inhale  1-2 puffs into the lungs every 6 (six) hours as needed for wheezing or shortness of breath. 1 each Ward, Tylene Fantasia, PA-C      PDMP not reviewed this encounter.   Ward, Tylene Fantasia, PA-C 01/28/22 2011

## 2023-07-16 ENCOUNTER — Encounter (HOSPITAL_COMMUNITY): Payer: Self-pay | Admitting: *Deleted

## 2023-07-16 ENCOUNTER — Ambulatory Visit (INDEPENDENT_AMBULATORY_CARE_PROVIDER_SITE_OTHER): Payer: Self-pay

## 2023-07-16 ENCOUNTER — Ambulatory Visit (HOSPITAL_COMMUNITY)
Admission: EM | Admit: 2023-07-16 | Discharge: 2023-07-16 | Disposition: A | Discharge status: Home / Self Care | Payer: Self-pay | Attending: Family Medicine | Admitting: Family Medicine

## 2023-07-16 DIAGNOSIS — K5909 Other constipation: Secondary | ICD-10-CM

## 2023-07-16 DIAGNOSIS — R1013 Epigastric pain: Secondary | ICD-10-CM

## 2023-07-16 DIAGNOSIS — Z789 Other specified health status: Secondary | ICD-10-CM

## 2023-07-16 HISTORY — DX: Calculus of gallbladder without cholecystitis without obstruction: K80.20

## 2023-07-16 LAB — POCT URINE PREGNANCY: Preg Test, Ur: NEGATIVE

## 2023-07-16 LAB — POCT URINALYSIS DIP (MANUAL ENTRY)
Bilirubin, UA: NEGATIVE
Glucose, UA: NEGATIVE mg/dL
Ketones, POC UA: NEGATIVE mg/dL
Nitrite, UA: NEGATIVE
Protein Ur, POC: NEGATIVE mg/dL
Spec Grav, UA: <=1.005 — AB (ref 1.010–1.025)
Urobilinogen, UA: 0.2 U/dL
pH, UA: 6.5 (ref 5.0–8.0)

## 2023-07-16 MED ORDER — MAGNESIUM CITRATE PO SOLN: 1.0000 | Freq: Once | ORAL | 0 refills | Status: AC

## 2023-07-16 MED ORDER — POLYETHYLENE GLYCOL 3350 17 GM/SCOOP PO POWD: 1.0000 | Freq: Once | ORAL | 0 refills | Status: AC

## 2023-07-16 MED ORDER — SENNOSIDES-DOCUSATE SODIUM 8.6-50 MG PO TABS: 1.0000 | ORAL_TABLET | Freq: Every day | ORAL | 0 refills | Status: DC

## 2023-07-16 NOTE — ED Triage Notes (Signed)
 Professional interpreter used for clinical intake.   Pt states that she is having abdominal pain x 3-4 months, she has hx of gallstones per chart she had her gallbladder removed in 2010. She is having some vomiting since last week. She is taking amox for the pain, she states it does help with the pain some. She bought the amox [place] the [place], she has been taking amox 250mg   2 tablets every 6 hours x 2 days.

## 2023-07-16 NOTE — ED Provider Notes (Signed)
 [facility]    CSN: [id number] Arrival date & time: 07/16/23  1034      History   Chief Complaint Chief Complaint  Patient presents with   Abdominal Pain   Emesis    HPI Lauren Barnes is a 38 y.o. female.   The patient is a 38 year old female here for abdominal pain which has been going on for several months but recently increased.  She is having pain after eating anything and has developed some diarrhea and small amounts over the last week with some trace mucus but no blood.  She did have a surgical removal of her gallbladder in 2010.  She started taking some amoxicillin from a [place] store earlier this week and has not had any relief.  She has been able to drink liquids without issue and denies any nausea.  She has had an IUD for the last several years and denies any chance of being pregnant.  She does not have any pain with urination or blood in her urine.  She denies any fever, chills, flank pain, rashes, yellowing of the skin, syncope, lightheadedness, confusion, chest pain, palpitations, difficulty breathing, joint pain or recent illness.  She does not know of any new foods or suspicious things that she ate.  She does not have any exposure to other sick contacts and no one else in the family has been sick.  The history is provided by the patient. The history is limited by a language barrier. A language interpreter was used.  Abdominal Pain Associated symptoms: constipation, diarrhea and vomiting   Associated symptoms: no chest pain, no chills, no dysuria, no fatigue, no fever, no hematuria, no nausea, no shortness of breath, no vaginal bleeding and no vaginal discharge   Emesis Associated symptoms: abdominal pain and diarrhea   Associated symptoms: no arthralgias, no chills, no fever, no headaches and no myalgias     Past Medical History:  Diagnosis Date   Gallstones     Patient Active Problem List   Diagnosis Date Noted   Medical history  non-contributory    Active labor 04/16/2014   VBAC, delivered 04/16/2014    Past Surgical History:  Procedure Laterality Date   CESAREAN SECTION     CHOLECYSTECTOMY  08/14/2008    OB History     Gravida  2   Para  2   Term  2   Preterm      AB      Living  2      SAB      IAB      Ectopic      Multiple      Live Births  2            Home Medications    Prior to Admission medications   Medication Sig Start Date End Date Taking? Authorizing Provider  magnesium  citrate SOLN Take 296 mLs (1 Bottle total) by mouth once for 1 dose. 07/16/23 07/16/23 Yes Janet Lonni FORBES, MD  polyethylene glycol powder (MIRALAX ) 17 GM/SCOOP powder Take 255 g by mouth once for 1 dose. 07/16/23 07/16/23 Yes Janet Lonni FORBES, MD  senna-docusate (SENOKOT-S) 8.6-50 MG tablet Take 1 tablet by mouth daily. 07/16/23  Yes Janet Lonni FORBES, MD  albuterol  (VENTOLIN  HFA) 108 (90 Base) MCG/ACT inhaler Inhale 1-2 puffs into the lungs every 6 (six) hours as needed for wheezing or shortness of breath. 01/28/22   Ward, Harlene PEDLAR, PA-C  docusate sodium  (COLACE) 100 MG capsule Take 1 capsule (  100 mg total) by mouth 2 (two) times daily. 04/18/14   Delma Shaper, MD  methocarbamol  (ROBAXIN ) 750 MG tablet Take 1-2 tablets (750-1,500 mg total) by mouth 3 (three) times daily as needed for muscle spasms. 05/09/18   Windle Almarie ORN, PA-C  Prenatal Vit-Fe Fumarate-FA (PRENATAL MULTIVITAMIN) TABS tablet Take 1 tablet by mouth daily at 12 noon.    [provider]    Family History History reviewed. No pertinent family history.  Social History Social History   Tobacco Use   Smoking status: Never   Smokeless tobacco: Never  Vaping Use   Vaping status: Never Used  Substance Use Topics   Alcohol use: No   Drug use: No     Allergies   Patient has no known allergies.   Review of Systems Review of Systems  Constitutional:  Positive for appetite change. Negative for activity change,  chills, diaphoresis, fatigue and fever.  Respiratory:  Negative for shortness of breath.   Cardiovascular:  Negative for chest pain and palpitations.  Gastrointestinal:  Positive for abdominal pain, constipation, diarrhea and vomiting. Negative for abdominal distention, anal bleeding, blood in stool, nausea and rectal pain.  Genitourinary:  Negative for difficulty urinating, dysuria, enuresis, flank pain, hematuria, pelvic pain, urgency, vaginal bleeding, vaginal discharge and vaginal pain.  Musculoskeletal:  Negative for arthralgias, back pain, joint swelling and myalgias.  Skin:  Negative for color change and rash.  Neurological:  Negative for syncope, light-headedness and headaches.     Physical Exam Triage Vital Signs ED Triage Vitals  Encounter Vitals Group     BP 07/16/23 1128 108/72     Systolic BP Percentile --      Diastolic BP Percentile --      Pulse Rate 07/16/23 1128 73     Resp 07/16/23 1128 16     Temp 07/16/23 1128 98.6 F (37 C)     Temp Source 07/16/23 1128 Oral     SpO2 07/16/23 1128 98 %     Weight --      Height --      Head Circumference --      Peak Flow --      Pain Score 07/16/23 1117 8     Pain Loc --      Pain Education --      Exclude from Growth Chart --    No data found.  Updated Vital Signs BP 108/72 (BP Location: Left Arm)   Pulse 73   Temp 98.6 F (37 C) (Oral)   Resp 16   LMP 07/16/2023 (Exact Date)   SpO2 98%   Visual Acuity Right Eye Distance:   Left Eye Distance:   Bilateral Distance:    Right Eye Near:   Left Eye Near:    Bilateral Near:     Physical Exam Vitals reviewed.  Constitutional:      Appearance: She is well-developed and normal weight.  HENT:     Head: Normocephalic and atraumatic.     Mouth/Throat:     Mouth: Mucous membranes are moist.  Eyes:     General: No scleral icterus.    Extraocular Movements: Extraocular movements intact.     Pupils: Pupils are equal, round, and reactive to light.   Cardiovascular:     Rate and Rhythm: Normal rate and regular rhythm.  Pulmonary:     Effort: Pulmonary effort is normal.     Breath sounds: Normal breath sounds.  Abdominal:     General: A surgical scar  is present. Bowel sounds are normal. There is distension (Mild). There are no signs of injury.     Palpations: Abdomen is soft. There is no shifting dullness, fluid wave, splenomegaly or mass.     Tenderness: There is abdominal tenderness in the epigastric area and left upper quadrant. There is no right CVA tenderness, left CVA tenderness, guarding or rebound. Negative signs include Murphy's sign, Rovsing's sign, McBurney's sign and psoas sign.     Hernia: No hernia is present.  Skin:    General: Skin is warm and dry.     Capillary Refill: Capillary refill takes more than 3 seconds.     Coloration: Skin is not jaundiced, mottled or pale.  Neurological:     General: No focal deficit present.     Mental Status: She is alert.  Psychiatric:        Mood and Affect: Mood normal.        Behavior: Behavior normal.      UC Treatments / Results  Labs (all labs ordered are listed, but only abnormal results are displayed) Labs Reviewed  POCT URINALYSIS DIP (MANUAL ENTRY) - Abnormal; Notable for the following components:      Result Value   Spec Grav, UA <=1.005 (*)    Blood, UA small (*)    Leukocytes, UA Moderate (2+) (*)    All other components within normal limits  URINE CULTURE  POCT URINE PREGNANCY    EKG   Radiology No results found.  Procedures Procedures (including critical care time)  Medications Ordered in UC Medications - No data to display  Initial Impression / Assessment and Plan / UC Course  I have reviewed the triage vital signs and the nursing notes.  Pertinent labs & imaging results that were available during my care of the patient were reviewed by me and considered in my medical decision making (see chart for details).     Abdominal pain 2/2 chronic  constipation - Urine pregnancy test negative. - UA with some moderate leukocytes but patient is asymptomatic - We will send the urine for culture.  If there is bacteria present we will send in some antibiotics - The patient does have a history of chronic constipation with previous abdominal surgery history. -Abdominal x-ray shows some significant constipation and distention of the colon. - It is likely her diarrhea that she is reporting is running around.  Currently she is stable with no fevers chills or other signs of systemic infection. -We discussed the need for increasing oral hydration, fiber intake, and starting a bowel regimen for the next 2-3 months.  She will need to follow-up with her primary care provider for further management. - We discussed the need to continue maintenance of her bowel regiment in order to allow her intestines to regain normal function, as well as complications of untreated constipation.   Final Clinical Impressions(s) / UC Diagnoses   Final diagnoses:  Epigastric pain  Chronic constipation     Discharge Instructions      Your abdominal pain is likely due to severe, chronic constipation. You need to start drinking 6 to 7 glasses/bottles of water a day in order to maintain good intestinal function and for medications to work Start taking MiraLAX  once a day as well as senna. If these do not work you can also take magnesium  citrate Increase your intake of whole fruits and vegetables for fiber If you develop severe, worsening abdominal pain he will need to go to the emergency room to  ensure you do not have an intestinal perforation. Your urine had some white blood cells in it.  If the culture grows bacteria we will send you some antibiotics.     ED Prescriptions     Medication Sig Dispense Auth. Provider   polyethylene glycol powder (MIRALAX ) 17 GM/SCOOP powder Take 255 g by mouth once for 1 dose. 255 g Janet Lonni BRAVO, MD   senna-docusate  (SENOKOT-S) 8.6-50 MG tablet Take 1 tablet by mouth daily. 30 tablet Janet Lonni BRAVO, MD   magnesium  citrate SOLN Take 296 mLs (1 Bottle total) by mouth once for 1 dose. 195 mL Janet Lonni BRAVO, MD      PDMP not reviewed this encounter.   Janet Lonni BRAVO, MD 07/16/23 1345

## 2023-07-16 NOTE — Discharge Instructions (Addendum)
 Your abdominal pain is likely due to severe, chronic constipation. You need to start drinking 6 to 7 glasses/bottles of water a day in order to maintain good intestinal function and for medications to work Start taking MiraLAX  once a day as well as senna. If these do not work you can also take magnesium  citrate Increase your intake of whole fruits and vegetables for fiber If you develop severe, worsening abdominal pain he will need to go to the emergency room to ensure you do not have an intestinal perforation. Your urine had some white blood cells in it.  If the culture grows bacteria we will send you some antibiotics.

## 2023-07-17 LAB — URINE CULTURE: Culture: NO GROWTH

## 2023-12-03 ENCOUNTER — Encounter (HOSPITAL_COMMUNITY): Payer: Self-pay

## 2023-12-03 ENCOUNTER — Ambulatory Visit (HOSPITAL_COMMUNITY)
Admission: EM | Admit: 2023-12-03 | Discharge: 2023-12-03 | Disposition: A | Discharge status: Home / Self Care | Payer: Self-pay | Attending: Family Medicine | Admitting: Family Medicine

## 2023-12-03 DIAGNOSIS — H0011 Chalazion right upper eyelid: Secondary | ICD-10-CM

## 2023-12-03 MED ORDER — EYE WASH OP SOLN: 1.0000 [drp] | OPHTHALMIC | 0 refills | Status: AC | PRN

## 2023-12-03 NOTE — ED Triage Notes (Signed)
 Patient here today with c/o right eye pain X 2 months. There is a small bump under her upper eye lid. Patient has also been having some headaches this week. She has been taking Tylenol  with some relief.

## 2023-12-03 NOTE — Discharge Instructions (Addendum)
 Continue to use warm compresses over the eye You can use the eyedrops for comfort Continue Tylenol  for the headaches and make sure that you continue good oral hydration I recommend following up with the family medicine clinic to establish a primary care provider. If not you can try ophthalmology If you develop sudden changes in your vision return to urgent care or the emergency room.

## 2023-12-03 NOTE — ED Provider Notes (Signed)
 [facility]    CSN: [id number] Arrival date & time: 12/03/23  0850      History   Chief Complaint Chief Complaint  Patient presents with   Eye Problem   Headache    HPI Lauren Barnes is a 38 y.o. female.   The patient reports 2 months of a small bump in her right upper eyelid that is increasing in size.  Over the last month it is started to cause her some pain towards the end of the day which is leading to headaches.  Her headaches have been controlled with Tylenol  and she has been drinking plenty of water.  She denies any blurry vision, double vision, or discharge from the eye.  She previously had a stye in the same area but closer to the edge of her eyelid which drained easily.  This current bump has not gotten hot, swollen, or red.  She has tried warm compresses and creams which have not helped.  The history is provided by the patient.  Eye Problem Associated symptoms: headaches   Associated symptoms: no discharge, no itching, no numbness, no photophobia and no redness   Headache Associated symptoms: eye pain (discomfort at the end of the day)   Associated symptoms: no congestion, no dizziness, no drainage, no ear pain, no fever, no numbness, no photophobia and no sinus pressure     Past Medical History:  Diagnosis Date   Gallstones     Patient Active Problem List   Diagnosis Date Noted   Medical history non-contributory    Active labor 04/16/2014   VBAC, delivered 04/16/2014    Past Surgical History:  Procedure Laterality Date   CESAREAN SECTION     CHOLECYSTECTOMY  08/14/2008    OB History     Gravida  2   Para  2   Term  2   Preterm      AB      Living  2      SAB      IAB      Ectopic      Multiple      Live Births  2            Home Medications    Prior to Admission medications   Medication Sig Start Date End Date Taking? Authorizing Provider  eye wash (,SODIUM/POTASSIUM/SOD CHLORIDE,) SOLN Place 1 drop  into the right eye as needed for dry eyes. 12/03/23 01/02/24 Yes Claybon Cuna, MD  docusate sodium  (COLACE) 100 MG capsule Take 1 capsule (100 mg total) by mouth 2 (two) times daily. 04/18/14   Barry Boring, MD  Prenatal Vit-Fe Fumarate-FA (PRENATAL MULTIVITAMIN) TABS tablet Take 1 tablet by mouth daily at 12 noon.    [provider]  senna-docusate (SENOKOT-S) 8.6-50 MG tablet Take 1 tablet by mouth daily. 07/16/23   Claybon Cuna, MD    Family History History reviewed. No pertinent family history.  Social History Social History   Tobacco Use   Smoking status: Never   Smokeless tobacco: Never  Vaping Use   Vaping status: Never Used  Substance Use Topics   Alcohol use: No   Drug use: No     Allergies   Patient has no known allergies.   Review of Systems Review of Systems  Constitutional:  Negative for chills and fever.  HENT:  Negative for congestion, ear pain, facial swelling, postnasal drip, rhinorrhea, sinus pressure and sinus pain.   Eyes:  Positive for pain (discomfort  at the end of the day). Negative for photophobia, discharge, redness, itching and visual disturbance.  Neurological:  Positive for headaches. Negative for dizziness, facial asymmetry, light-headedness and numbness.  Psychiatric/Behavioral:  Negative for hallucinations.      Physical Exam Triage Vital Signs ED Triage Vitals [12/03/23 1008]  Encounter Vitals Group     BP 105/74     Systolic BP Percentile      Diastolic BP Percentile      Pulse Rate 79     Resp 16     Temp 98.6 F (37 C)     Temp Source Oral     SpO2 97 %     Weight      Height      Head Circumference      Peak Flow      Pain Score 6     Pain Loc      Pain Education      Exclude from Growth Chart    No data found.  Updated Vital Signs BP 105/74 (BP Location: Left Arm)   Pulse 79   Temp 98.6 F (37 C) (Oral)   Resp 16   LMP 11/28/2023 (Exact Date)   SpO2 97%   Breastfeeding No   Visual  Acuity Right Eye Distance:   Left Eye Distance:   Bilateral Distance:    Right Eye Near:   Left Eye Near:    Bilateral Near:     Physical Exam Vitals reviewed.  Constitutional:      General: She is not in acute distress.    Appearance: She is normal weight. She is not ill-appearing, toxic-appearing or diaphoretic.  HENT:     Head: Normocephalic and atraumatic.  Eyes:     General: No scleral icterus.    Extraocular Movements: Extraocular movements intact.     Pupils: Pupils are equal, round, and reactive to light. Pupils are equal.     Comments: The right upper palpebra has a small, mobile, NT 1mm mass that is not deep to the mucosal surface, 1mm proximal to the palpebral edge. Pt able to blink without issue No pain with EOM No discharge    Pulmonary:     Effort: Pulmonary effort is normal.  Neurological:     Mental Status: She is alert.      UC Treatments / Results  Labs (all labs ordered are listed, but only abnormal results are displayed) Labs Reviewed - No data to display  EKG   Radiology No results found.  Procedures Procedures (including critical care time)  Medications Ordered in UC Medications - No data to display  Initial Impression / Assessment and Plan / UC Course  I have reviewed the triage vital signs and the nursing notes.  Pertinent labs & imaging results that were available during my care of the patient were reviewed by me and considered in my medical decision making (see chart for details).     Chalazion  - The patient's chalazion has increased in size over the last 2 months and is now causing eye irritation, especially at the end of the day.  - There is no discharge or signs of infection and the lesion is 1mm proximal to the palpebral edge which makes spontaneous drainage less likely.  - Given her sxs I recommended follow up with ophthalmology or establishing with a PCP in order to have the lesion incised.  - In the meantime she can  continue warm compresses and eye drops for comfort. Return criteria discussed.  -  The patient voiced understanding and agreement with the plan.   Final Clinical Impressions(s) / UC Diagnoses   Final diagnoses:  Chalazion of right upper eyelid     Discharge Instructions      Continue to use warm compresses over the eye You can use the eyedrops for comfort Continue Tylenol  for the headaches and make sure that you continue good oral hydration I recommend following up with the family medicine clinic to establish a primary care provider. If not you can try ophthalmology If you develop sudden changes in your vision return to urgent care or the emergency room.   ED Prescriptions     Medication Sig Dispense Auth. Provider   eye wash (,SODIUM/POTASSIUM/SOD CHLORIDE,) SOLN Place 1 drop into the right eye as needed for dry eyes. 30 mL Claybon Cuna, MD      PDMP not reviewed this encounter.   Claybon Cuna, MD 12/03/23 2028

## 2023-12-11 ENCOUNTER — Ambulatory Visit (HOSPITAL_COMMUNITY)
Admission: RE | Admit: 2023-12-11 | Discharge: 2023-12-11 | Disposition: A | Discharge status: Home / Self Care | Payer: Self-pay | Source: Ambulatory Visit | Attending: Obstetrics & Gynecology | Admitting: Obstetrics & Gynecology

## 2023-12-11 ENCOUNTER — Other Ambulatory Visit (HOSPITAL_COMMUNITY): Payer: Self-pay | Admitting: Obstetrics & Gynecology

## 2023-12-11 DIAGNOSIS — T8339XD Other mechanical complication of intrauterine contraceptive device, subsequent encounter: Secondary | ICD-10-CM | POA: Insufficient documentation

## 2023-12-21 ENCOUNTER — Ambulatory Visit: Payer: Self-pay | Admitting: Obstetrics and Gynecology

## 2024-01-04 ENCOUNTER — Ambulatory Visit (INDEPENDENT_AMBULATORY_CARE_PROVIDER_SITE_OTHER): Payer: Self-pay | Admitting: Obstetrics and Gynecology

## 2024-01-04 ENCOUNTER — Encounter: Payer: Self-pay | Admitting: Obstetrics and Gynecology

## 2024-01-04 VITALS — BP 117/79 | HR 71 | Ht 61.0 in | Wt 171.0 lb

## 2024-01-04 DIAGNOSIS — T8339XA Other mechanical complication of intrauterine contraceptive device, initial encounter: Secondary | ICD-10-CM

## 2024-01-04 DIAGNOSIS — Z3202 Encounter for pregnancy test, result negative: Secondary | ICD-10-CM

## 2024-01-04 DIAGNOSIS — Z30432 Encounter for removal of intrauterine contraceptive device: Secondary | ICD-10-CM

## 2024-01-04 DIAGNOSIS — Z1331 Encounter for screening for depression: Secondary | ICD-10-CM

## 2024-01-04 LAB — POCT URINE PREGNANCY: Preg Test, Ur: NEGATIVE

## 2024-01-04 NOTE — Progress Notes (Signed)
 38 yo P2 here for IUD removal. Patient had Paraguard IUD in place since 2015 and had it removed in May with [facility]. IUD noted to be broken at time of removal and ultrasound demonstrates a retained 7 mm fragment. Patient presents today for removal. She has been taking OCP for contraception. Patient is without any complaints  Past Medical History:  Diagnosis Date   Anemia    Anxiety    Asthma    Depression    Gallstones    Past Surgical History:  Procedure Laterality Date   CESAREAN SECTION     CHOLECYSTECTOMY  08/14/2008   History reviewed. No pertinent family history. Social History   Tobacco Use   Smoking status: Never   Smokeless tobacco: Never  Vaping Use   Vaping status: Never Used  Substance Use Topics   Alcohol use: No   Drug use: No   ROS  See pertinent in HPI. All other systems reviewed and non contributory Blood pressure 117/79, pulse 71, height 5' 1 (1.549 m), weight 171 lb (77.6 kg), last menstrual period 11/22/2023. GENERAL: Well-developed, well-nourished female in no acute distress.  ABDOMEN: Soft, nontender, nondistended. No organomegaly. PELVIC: Normal external female genitalia. Vagina is pink and rugated.  Normal discharge. Normal appearing cervix. Chaperone present during the pelvic exam EXTREMITIES: No cyanosis, clubbing, or edema, 2+ distal pulses.  A/P 38 yo with retained IUD fragment requesting removal - Discussed option to have the procedure done under anesthesia and patient opted to try in the office - GYNECOLOGY CLINIC PROCEDURE NOTE IUD fragment removal  Patient identified, informed consent performed, consent signed.  Patient was in the dorsal lithotomy position, normal external genitalia was noted.  A speculum was placed in the patient's vagina, normal discharge was noted, no lesions. The cervix was visualized, no lesions, no abnormal discharge.  A Kelly forceps was introduced into the endometrial cavity and the IUD fragment could not be removed despite  being palpated.  Patient requested to stop the procedure.    Patient desires to proceed with removal under anesthesia with hysteroscopy. Risks, benefits and alternatives were explained including but not limited to risks of bleeding, infection, uterine perforation and damage to adjacent organs. Patient verbalized understanding and wishes to proceed. Patient plans to continue COC. Patient plans to apply for financial assistance

## 2024-01-04 NOTE — Progress Notes (Addendum)
 38 y.o. New GYN presents for AEX/IUD fragment removal.  Pt missed her period for June.  LMP 11/22/23. Pt gets her PAP done at the [facility].   UPT Negative

## 2024-01-20 ENCOUNTER — Telehealth: Payer: Self-pay

## 2024-01-20 NOTE — Telephone Encounter (Signed)
 Patient also stated that she does not have insurance and will pay in cash for the 04/07/24 surgery w/ Dr. Alger.

## 2024-01-20 NOTE — Telephone Encounter (Signed)
 Patient was provided my number from her doctor's office. Patient called to request that her surgery be scheduled with Dr. Alger. I confirmed the order was received on 01/04/24 and Dr. Alger had a few patients ahead of her on the wait list. Patient stated she needed to be scheduled asap. I advised Dr. Alger was already full for 01/26/24 and was not scheduled in the OR in August. I offered Dr. Keven first available on 04/07/24 at 1pm/[facility]. Patient accepted. Pre-Op instructions were provided. Patient confirmed understanding.

## 2024-04-05 ENCOUNTER — Encounter (HOSPITAL_COMMUNITY): Payer: Self-pay | Admitting: Obstetrics and Gynecology

## 2024-04-05 NOTE — Progress Notes (Signed)
 Spoke w/ via phone for pre-op interview--- Lauren Barnes with pacific interpreter ID# [id number] Lab needs dos----  UPT per anesthesia. Surgeon orders requested 04/05/24.       Lab results------ COVID test -----patient states asymptomatic no test needed Arrive at -------0830 NPO after MN NO Solid Food.  Pre-Surgery Ensure or G2:  Med rec completed Medications to take morning of surgery -----NONE Diabetic medication -----  GLP1 agonist last dose: GLP1 instructions:  Patient instructed no nail polish to be worn day of surgery Patient instructed to bring photo id and insurance card day of surgery Patient aware to have Driver (ride ) / caregiver    for 24 hours after surgery - Husband Lauren Barnes Patient Special Instructions ----- Pre-Op special Instructions ----- Interpreter requested for day of surgery.  Patient verbalized understanding of instructions that were given at this phone interview. Patient denies chest pain, sob, fever, cough at the interview.

## 2024-04-06 NOTE — H&P (Signed)
 Lauren Barnes is an 38 y.o. female P2 here for scheduled IUD removal following failed attempt in the office. Patient has had Paraguard IUD since 2015. During office removal at [facility], it was noted that the IUD was broken and a retained fragment was confirmed on ultrasound. Patient is without any complaints. She has been using birth control pills for contraception     Menstrual History: Patient's last menstrual period was 11/22/2023 (exact date).    Past Medical History:  Diagnosis Date   Anemia    Anxiety    Asthma    Depression    Gallstones     Past Surgical History:  Procedure Laterality Date   CESAREAN SECTION     CHOLECYSTECTOMY  08/14/2008    History reviewed. No pertinent family history.  Social History:  reports that she has never smoked. She has never used smokeless tobacco. She reports that she does not drink alcohol and does not use drugs.  Allergies: No Known Allergies  No medications prior to admission.    Review of Systems See pertinent in HPI. All other systems reviewed and non contributory Weight 76.7 kg, last menstrual period 11/22/2023. Physical Exam GENERAL: Well-developed, well-nourished female in no acute distress.  LUNGS: Clear to auscultation bilaterally.  HEART: Regular rate and rhythm. ABDOMEN: Soft, nontender, nondistended. No organomegaly. PELVIC: Deferred to OR EXTREMITIES: No cyanosis, clubbing, or edema, 2+ distal pulses.  No results found for this or any previous visit (from the past 24 hours).  11/2023 ultrasound FINDINGS: Uterus   Measurements: 8.7 x 3.3 x 4.8 cm = volume: 72 mL. No fibroids or other mass visualized.   Endometrium   Thickness: 8 mm. 6 mm fragment of the IUD is seen in the lower uterine segment within the endometrium.   Right ovary   Measurements: 2.8 x 1.7 x 1.5 cm = volume: 3.7 mL. Grossly normal appearance. Limited visualization.   Left ovary   Not visualized.   Other findings   No abnormal  free fluid.  Small anterior bladder diverticulum.   IMPRESSION: 6 mm fragment of the IUD is seen in the lower uterine segment within the endometrium.   These results will be called to the ordering clinician or representative by the Radiologist Assistant, and communication documented in the PACS or [employer].     Electronically Signed   By: Norman Gatlin M.D.   On: 12/19/2023 22:33  Assessment/Plan: 38 yo P2 with retained IUD fragment here for removal - Risks, benefits and alternatives were explained including but not limited to risks of bleeding, infection and damage to adjacent organs. Patient verbalized understanding and all questions were answered. Patient consented for operative hysteroscopy with IUD removal  Lauren Barnes 04/06/2024, 7:06 AM

## 2024-04-07 ENCOUNTER — Ambulatory Visit (HOSPITAL_COMMUNITY)
Admission: RE | Admit: 2024-04-07 | Discharge: 2024-04-07 | Disposition: A | Discharge status: Home / Self Care | Payer: Self-pay | Attending: Obstetrics and Gynecology | Admitting: Obstetrics and Gynecology

## 2024-04-07 ENCOUNTER — Other Ambulatory Visit: Payer: Self-pay

## 2024-04-07 ENCOUNTER — Ambulatory Visit (HOSPITAL_COMMUNITY): Payer: Self-pay

## 2024-04-07 ENCOUNTER — Encounter (HOSPITAL_COMMUNITY): Payer: Self-pay | Admitting: Obstetrics and Gynecology

## 2024-04-07 ENCOUNTER — Encounter (HOSPITAL_COMMUNITY)
Admission: RE | Disposition: A | Discharge status: Home / Self Care | Payer: Self-pay | Source: Home / Self Care | Attending: Obstetrics and Gynecology

## 2024-04-07 DIAGNOSIS — Z30432 Encounter for removal of intrauterine contraceptive device: Secondary | ICD-10-CM

## 2024-04-07 DIAGNOSIS — T8332XA Displacement of intrauterine contraceptive device, initial encounter: Secondary | ICD-10-CM

## 2024-04-07 DIAGNOSIS — Z01818 Encounter for other preprocedural examination: Secondary | ICD-10-CM

## 2024-04-07 DIAGNOSIS — T8331XA Breakdown (mechanical) of intrauterine contraceptive device, initial encounter: Secondary | ICD-10-CM | POA: Insufficient documentation

## 2024-04-07 DIAGNOSIS — Y762 Prosthetic and other implants, materials and accessory obstetric and gynecological devices associated with adverse incidents: Secondary | ICD-10-CM | POA: Insufficient documentation

## 2024-04-07 DIAGNOSIS — T8332XD Displacement of intrauterine contraceptive device, subsequent encounter: Secondary | ICD-10-CM

## 2024-04-07 HISTORY — PX: OPERATIVE ULTRASOUND: SHX5996

## 2024-04-07 HISTORY — DX: Nausea with vomiting, unspecified: R11.2

## 2024-04-07 HISTORY — PX: HYSTEROSCOPY WITH IMPACTED FOREIGN BODY REMOVAL: SHX7590

## 2024-04-07 LAB — POCT PREGNANCY, URINE: Preg Test, Ur: NEGATIVE

## 2024-04-07 SURGERY — HYSTEROSCOPY WITH IMPACTED FOREIGN BODY REMOVAL
Anesthesia: General

## 2024-04-07 MED ORDER — CHLORHEXIDINE GLUCONATE 0.12 % MT SOLN
OROMUCOSAL | Status: AC
  Filled 2024-04-07: qty 15

## 2024-04-07 MED ORDER — PROPOFOL 10 MG/ML IV BOLUS
INTRAVENOUS | Status: AC
  Filled 2024-04-07: qty 20

## 2024-04-07 MED ORDER — DEXMEDETOMIDINE HCL IN NACL 80 MCG/20ML IV SOLN
INTRAVENOUS | Status: DC | PRN
  Administered 2024-04-07: 4 ug via INTRAVENOUS

## 2024-04-07 MED ORDER — OXYCODONE HCL 5 MG PO TABS: 5.0000 mg | ORAL_TABLET | Freq: Once | ORAL | Status: DC | PRN

## 2024-04-07 MED ORDER — ONDANSETRON HCL 4 MG/2ML IJ SOLN: 4.0000 mg | Freq: Once | INTRAMUSCULAR | Status: DC | PRN

## 2024-04-07 MED ORDER — FENTANYL CITRATE (PF) 250 MCG/5ML IJ SOLN
INTRAMUSCULAR | Status: DC | PRN
  Administered 2024-04-07: 100 ug via INTRAVENOUS

## 2024-04-07 MED ORDER — ORAL CARE MOUTH RINSE: 15.0000 mL | Freq: Once | OROMUCOSAL | Status: AC

## 2024-04-07 MED ORDER — PHENYLEPHRINE 80 MCG/ML (10ML) SYRINGE FOR IV PUSH (FOR BLOOD PRESSURE SUPPORT)
PREFILLED_SYRINGE | INTRAVENOUS | Status: AC
  Filled 2024-04-07: qty 10

## 2024-04-07 MED ORDER — FENTANYL CITRATE (PF) 250 MCG/5ML IJ SOLN
INTRAMUSCULAR | Status: AC
  Filled 2024-04-07: qty 5

## 2024-04-07 MED ORDER — FENTANYL CITRATE (PF) 100 MCG/2ML IJ SOLN: 25.0000 ug | INTRAMUSCULAR | Status: DC | PRN

## 2024-04-07 MED ORDER — LIDOCAINE 2% (20 MG/ML) 5 ML SYRINGE
INTRAMUSCULAR | Status: DC | PRN
  Administered 2024-04-07: 100 mg via INTRAVENOUS

## 2024-04-07 MED ORDER — SODIUM CHLORIDE 0.9 % IR SOLN
Status: DC | PRN
  Administered 2024-04-07: 3000 mL

## 2024-04-07 MED ORDER — MIDAZOLAM HCL 2 MG/2ML IJ SOLN
INTRAMUSCULAR | Status: DC | PRN
Start: 2024-04-07 — End: 2024-04-07
  Administered 2024-04-07: 2 mg via INTRAVENOUS

## 2024-04-07 MED ORDER — POVIDONE-IODINE 10 % EX SWAB: 2.0000 | Freq: Once | CUTANEOUS | Status: DC

## 2024-04-07 MED ORDER — DEXAMETHASONE SODIUM PHOSPHATE 10 MG/ML IJ SOLN
INTRAMUSCULAR | Status: DC | PRN
  Administered 2024-04-07: 10 mg via INTRAVENOUS

## 2024-04-07 MED ORDER — LIDOCAINE 2% (20 MG/ML) 5 ML SYRINGE
INTRAMUSCULAR | Status: AC
  Filled 2024-04-07: qty 5

## 2024-04-07 MED ORDER — LACTATED RINGERS IV SOLN: INTRAVENOUS | Status: DC

## 2024-04-07 MED ORDER — DEXAMETHASONE SODIUM PHOSPHATE 10 MG/ML IJ SOLN
INTRAMUSCULAR | Status: AC
  Filled 2024-04-07: qty 1

## 2024-04-07 MED ORDER — MIDAZOLAM HCL 2 MG/2ML IJ SOLN
INTRAMUSCULAR | Status: AC
  Filled 2024-04-07: qty 2

## 2024-04-07 MED ORDER — DROPERIDOL 2.5 MG/ML IJ SOLN: 0.6250 mg | Freq: Once | INTRAMUSCULAR | Status: DC | PRN

## 2024-04-07 MED ORDER — CHLORHEXIDINE GLUCONATE 0.12 % MT SOLN
15.0000 mL | Freq: Once | OROMUCOSAL | Status: AC
  Administered 2024-04-07: 15 mL via OROMUCOSAL

## 2024-04-07 MED ORDER — OXYCODONE HCL 5 MG/5ML PO SOLN: 5.0000 mg | Freq: Once | ORAL | Status: DC | PRN

## 2024-04-07 MED ORDER — PHENYLEPHRINE 80 MCG/ML (10ML) SYRINGE FOR IV PUSH (FOR BLOOD PRESSURE SUPPORT)
PREFILLED_SYRINGE | INTRAVENOUS | Status: DC | PRN
  Administered 2024-04-07 (×3): 80 ug via INTRAVENOUS

## 2024-04-07 MED ORDER — IBUPROFEN 600 MG PO TABS: 600.0000 mg | ORAL_TABLET | Freq: Four times a day (QID) | ORAL | 3 refills | Status: AC | PRN

## 2024-04-07 MED ORDER — ONDANSETRON HCL 4 MG/2ML IJ SOLN
INTRAMUSCULAR | Status: AC
  Filled 2024-04-07: qty 2

## 2024-04-07 MED ORDER — PROPOFOL 10 MG/ML IV BOLUS
INTRAVENOUS | Status: DC | PRN
  Administered 2024-04-07: 150 mg via INTRAVENOUS

## 2024-04-07 SURGICAL SUPPLY — 9 items
GLOVE BIOGEL PI IND STRL 6.5 (GLOVE) ×2 IMPLANT
GLOVE BIOGEL PI MICRO STRL 6 (GLOVE) ×2 IMPLANT
GLOVE SURG POLYISO LF SZ6.5 (GLOVE) ×2 IMPLANT
GOWN STRL REUS W/ TWL LRG LVL3 (GOWN DISPOSABLE) ×2 IMPLANT
PACK VAGINAL MINOR WOMEN LF (CUSTOM PROCEDURE TRAY) ×2 IMPLANT
PAD OB MATERNITY 11 LF (PERSONAL CARE ITEMS) ×2 IMPLANT
SLEEVE SCD COMPRESS KNEE MED (STOCKING) ×2 IMPLANT
TOWEL GREEN STERILE FF (TOWEL DISPOSABLE) ×2 IMPLANT
TUBING TUR DISP (UROLOGICAL SUPPLIES) IMPLANT

## 2024-04-07 NOTE — Anesthesia Preprocedure Evaluation (Addendum)
 Anesthesia Evaluation  Patient identified by MRN, date of birth, ID band Patient awake    Reviewed: Allergy & Precautions, NPO status , Patient's Chart, lab work & pertinent test results  History of Anesthesia Complications (+) PONV  Airway Mallampati: II  TM Distance: >3 FB     Dental no notable dental hx. (+) Teeth Intact, Dental Advisory Given   Pulmonary asthma    Pulmonary exam normal breath sounds clear to auscultation       Cardiovascular negative cardio ROS Normal cardiovascular exam Rhythm:Regular Rate:Normal     Neuro/Psych  PSYCHIATRIC DISORDERS Anxiety Depression    negative neurological ROS     GI/Hepatic negative GI ROS, Neg liver ROS,,,  Endo/Other  Obesity  Renal/GU negative Renal ROS  negative genitourinary   Musculoskeletal negative musculoskeletal ROS (+)    Abdominal  (+) + obese  Peds  Hematology  (+) Blood dyscrasia, anemia   Anesthesia Other Findings   Reproductive/Obstetrics Retained/Malpositioned IUD                              Anesthesia Physical Anesthesia Plan  ASA: 2  Anesthesia Plan: General   Post-op Pain Management: Minimal or no pain anticipated and Dilaudid IV   Induction: Intravenous  PONV Risk Score and Plan: 4 or greater and Treatment may vary due to age or medical condition, Ondansetron , Dexamethasone , Propofol  infusion and TIVA  Airway Management Planned: LMA  Additional Equipment: None  Intra-op Plan:   Post-operative Plan: Extubation in OR  Informed Consent: I have reviewed the patients History and Physical, chart, labs and discussed the procedure including the risks, benefits and alternatives for the proposed anesthesia with the patient or authorized representative who has indicated his/her understanding and acceptance.     Dental advisory given and Interpreter used for interview  Plan Discussed with: Anesthesiologist and  CRNA  Anesthesia Plan Comments:          Anesthesia Quick Evaluation

## 2024-04-07 NOTE — Anesthesia Postprocedure Evaluation (Signed)
 Anesthesia Post Note  Patient: Lauren Barnes  Procedure(s) Performed: DIAGNOSTIC HYSTEROSCOPY US  INTRAOPERATIVE     Patient location during evaluation: PACU Anesthesia Type: General Level of consciousness: awake and alert and oriented Pain management: pain level controlled Vital Signs Assessment: post-procedure vital signs reviewed and stable Respiratory status: spontaneous breathing, nonlabored ventilation and respiratory function stable Cardiovascular status: blood pressure returned to baseline and stable Postop Assessment: no apparent nausea or vomiting Anesthetic complications: no   No notable events documented.  Last Vitals:  Vitals:   04/07/24 1104 04/07/24 1115  BP: 96/67 104/78  Pulse: 88 76  Resp: 13 13  Temp: 36.4 C   SpO2: 99% 98%    Last Pain:  Vitals:   04/07/24 1115  TempSrc:   PainSc: 3                  Dontay Harm A.

## 2024-04-07 NOTE — Transfer of Care (Signed)
 Immediate Anesthesia Transfer of Care Note  Patient: Lauren Barnes  Procedure(s) Performed: DIAGNOSTIC HYSTEROSCOPY US  INTRAOPERATIVE  Patient Location: PACU  Anesthesia Type:General  Level of Consciousness: oriented, drowsy, and patient cooperative  Airway & Oxygen Therapy: Patient Spontanous Breathing  Post-op Assessment: Report given to RN, Patient moving all extremities X 4, and Patient able to stick tongue midline  Post vital signs: Reviewed and stable  Last Vitals:  Vitals Value Taken Time  BP 96/67 04/07/24 11:04  Temp 97.6   Pulse 88 04/07/24 11:05  Resp 13 04/07/24 11:05  SpO2 99 % 04/07/24 11:05  Vitals shown include unfiled device data.  Last Pain:  Vitals:   04/07/24 0916  TempSrc:   PainSc: 0-No pain      Patients Stated Pain Goal: 7 (04/07/24 0916)  Complications: No notable events documented.

## 2024-04-07 NOTE — Anesthesia Procedure Notes (Signed)
 Procedure Name: LMA Insertion Date/Time: 04/07/2024 10:22 AM  Performed by: Viviana Almarie DASEN, CRNAPre-anesthesia Checklist: Patient identified, Emergency Drugs available, Suction available and Patient being monitored Patient Re-evaluated:Patient Re-evaluated prior to induction Oxygen Delivery Method: Circle System Utilized Preoxygenation: Pre-oxygenation with 100% oxygen Induction Type: IV induction Ventilation: Mask ventilation without difficulty LMA: LMA inserted LMA Size: 4.0 Tube type: Oral Number of attempts: 1 Airway Equipment and Method: Bite block Placement Confirmation: positive ETCO2 Tube secured with: Tape Dental Injury: Teeth and Oropharynx as per pre-operative assessment

## 2024-04-07 NOTE — Op Note (Signed)
 PREOPERATIVE DIAGNOSIS:  Retained IUD fragment POSTOPERATIVE DIAGNOSIS: Normal uterine cavity PROCEDURE: Hysteroscopy and transvaginal ultrasound SURGEON:  Dr. Winton Felt   INDICATIONS: 38 y.o. [id number]  here for scheduled surgery for retained IUD fragment.   Risks of surgery were discussed with the patient including but not limited to: bleeding which may require transfusion; infection which may require antibiotics; injury to uterus or surrounding organs; intrauterine scarring which may impair future fertility; need for additional procedures including laparotomy or laparoscopy; and other postoperative/anesthesia complications. Written informed consent was obtained.    FINDINGS:  An 8- week size uterus.  Proliferative endometrium.  Normal ostia bilaterally.  ANESTHESIA:   General INTRAVENOUS FLUIDS:  600 ml of LR FLUID DEFICITS:  20ml of normal saline ESTIMATED BLOOD LOSS:  Less than 20 ml SPECIMENS: None COMPLICATIONS:  None immediate.  PROCEDURE DETAILS:  The patient was taken to the operating room where general anesthesia was administered and was found to be adequate.  After an adequate timeout was performed, she was placed in the dorsal lithotomy position and examined; then prepped and draped in the sterile manner.   Her bladder was catheterized for an unmeasured amount of clear, yellow urine. A speculum was then placed in the patient's vagina and a single tooth tenaculum was applied to the anterior lip of the cervix.  The cervix was sounded to 8 cm and did not require dilation.The 5 mm diagnostic hysteroscope was introduced with ease under direct visualization using normal saline as a suspension medium.  The uterine cavity was carefully examined, both ostia were recognized, and diffusely proliferative endometrium was noted.   After further careful visualization of the uterine cavity, the IUD fragment was not visualized. Intra-operative transvaginal ultrasound was performed and the previously  seen 7 mm fragment was no longer visualized. There was no evidence of retained IUD fragment on pelvic ultrasound.  The tenaculum was removed from the anterior lip of the cervix and the vaginal speculum was removed after noting good hemostasis.  The patient tolerated the procedure well and was taken to the recovery area awake, extubated and in stable condition.

## 2024-04-08 ENCOUNTER — Encounter (HOSPITAL_COMMUNITY): Payer: Self-pay | Admitting: Obstetrics and Gynecology

## 2024-04-28 ENCOUNTER — Encounter: Payer: Self-pay | Admitting: Obstetrics and Gynecology

## 2024-04-28 ENCOUNTER — Ambulatory Visit (INDEPENDENT_AMBULATORY_CARE_PROVIDER_SITE_OTHER): Payer: Self-pay | Admitting: Obstetrics and Gynecology

## 2024-04-28 VITALS — BP 108/75 | HR 76 | Wt 175.0 lb

## 2024-04-28 DIAGNOSIS — Z9889 Other specified postprocedural states: Secondary | ICD-10-CM

## 2024-04-28 NOTE — Progress Notes (Signed)
 38 yo P2 returning to the office for post op check. Patient reports feeling well and is without any complaints. She denies any pelvic pain or abnormal discharge  Past Medical History:  Diagnosis Date   Anemia    Anxiety    Asthma    Depression    Gallstones    PONV (postoperative nausea and vomiting)    Past Surgical History:  Procedure Laterality Date   CESAREAN SECTION  2009   CHOLECYSTECTOMY  08/14/2008   HYSTEROSCOPY WITH IMPACTED FOREIGN BODY REMOVAL N/A 04/07/2024   Procedure: DIAGNOSTIC HYSTEROSCOPY;  Surgeon: Alger Gong, MD;  Location: [facility] OR;  Service: Gynecology;  Laterality: N/A;   OPERATIVE ULTRASOUND  04/07/2024   Procedure: US  INTRAOPERATIVE;  Surgeon: Alger Gong, MD;  Location: [facility] OR;  Service: Gynecology;;   History reviewed. No pertinent family history. Social History   Tobacco Use   Smoking status: Never   Smokeless tobacco: Never  Vaping Use   Vaping status: Never Used  Substance Use Topics   Alcohol use: No   Drug use: No   ROS See pertinent in HPI. All other systems reviewed and non contributory Blood pressure 108/75, pulse 76, weight 175 lb (79.4 kg), last menstrual period 04/11/2024. GENERAL: Well-developed, well-nourished female in no acute distress.  NEURO: alert and oriented x 3  A/P 38 yo here for post op check following diagnostic hysteroscopy on 04/07/24 - Findings of surgery reviewed with the patient - Patient is cleared to resume all activities of daily living - Patient desires to transfer care to our office for annual exam - Records requested - RTC in 6 months for annual exam

## 2024-04-28 NOTE — Progress Notes (Signed)
 Pt is in office for routine post op.  Pt would like to verify everything was removed as needed. Pt states she is doing well.
